# Patient Record
Sex: Male | Born: 1950 | Race: White | Hispanic: No | Marital: Single | Smoking: Former smoker
Health system: Southern US, Community
[De-identification: ages and names within clinical notes are randomized; demographics above are authoritative.]

## PROBLEM LIST (undated history)

## (undated) DIAGNOSIS — E785 Hyperlipidemia, unspecified: Secondary | ICD-10-CM

## (undated) DIAGNOSIS — T7840XA Allergy, unspecified, initial encounter: Secondary | ICD-10-CM

## (undated) DIAGNOSIS — Z860101 Personal history of adenomatous and serrated colon polyps: Secondary | ICD-10-CM

## (undated) DIAGNOSIS — I499 Cardiac arrhythmia, unspecified: Secondary | ICD-10-CM

## (undated) DIAGNOSIS — Z86010 Personal history of colonic polyps: Secondary | ICD-10-CM

## (undated) DIAGNOSIS — K5790 Diverticulosis of intestine, part unspecified, without perforation or abscess without bleeding: Secondary | ICD-10-CM

## (undated) DIAGNOSIS — M109 Gout, unspecified: Secondary | ICD-10-CM

## (undated) DIAGNOSIS — Z8679 Personal history of other diseases of the circulatory system: Secondary | ICD-10-CM

## (undated) HISTORY — DX: Personal history of colonic polyps: Z86.010

## (undated) HISTORY — PX: EXTERNAL EAR SURGERY: SHX627

## (undated) HISTORY — DX: Hyperlipidemia, unspecified: E78.5

## (undated) HISTORY — DX: Personal history of adenomatous and serrated colon polyps: Z86.0101

## (undated) HISTORY — DX: Allergy, unspecified, initial encounter: T78.40XA

## (undated) HISTORY — DX: Gout, unspecified: M10.9

## (undated) HISTORY — PX: POLYPECTOMY: SHX149

## (undated) HISTORY — PX: OTHER SURGICAL HISTORY: SHX169

## (undated) HISTORY — DX: Personal history of other diseases of the circulatory system: Z86.79

## (undated) HISTORY — PX: COLONOSCOPY: SHX174

---
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
## 1985-12-01 HISTORY — PX: HEMORRHOID SURGERY: SHX153

## 2006-11-02 ENCOUNTER — Ambulatory Visit: Payer: Self-pay | Admitting: Family Medicine

## 2009-04-23 ENCOUNTER — Emergency Department (HOSPITAL_COMMUNITY): Admission: EM | Admit: 2009-04-23 | Discharge: 2009-04-23 | Payer: Self-pay | Admitting: Emergency Medicine

## 2009-09-27 ENCOUNTER — Encounter (INDEPENDENT_AMBULATORY_CARE_PROVIDER_SITE_OTHER): Payer: Self-pay | Admitting: *Deleted

## 2009-10-18 ENCOUNTER — Encounter (INDEPENDENT_AMBULATORY_CARE_PROVIDER_SITE_OTHER): Payer: Self-pay

## 2009-10-22 ENCOUNTER — Ambulatory Visit: Payer: Self-pay | Admitting: Gastroenterology

## 2009-10-22 ENCOUNTER — Encounter (INDEPENDENT_AMBULATORY_CARE_PROVIDER_SITE_OTHER): Payer: Self-pay | Admitting: *Deleted

## 2009-12-12 ENCOUNTER — Ambulatory Visit: Payer: Self-pay | Admitting: Gastroenterology

## 2009-12-14 ENCOUNTER — Encounter: Payer: Self-pay | Admitting: Gastroenterology

## 2010-02-17 ENCOUNTER — Encounter: Payer: Self-pay | Admitting: Cardiovascular Disease

## 2010-03-27 DIAGNOSIS — E785 Hyperlipidemia, unspecified: Secondary | ICD-10-CM | POA: Insufficient documentation

## 2010-03-27 DIAGNOSIS — I4949 Other premature depolarization: Secondary | ICD-10-CM | POA: Insufficient documentation

## 2010-03-27 DIAGNOSIS — F17200 Nicotine dependence, unspecified, uncomplicated: Secondary | ICD-10-CM | POA: Insufficient documentation

## 2010-03-27 DIAGNOSIS — R079 Chest pain, unspecified: Secondary | ICD-10-CM | POA: Insufficient documentation

## 2010-03-28 ENCOUNTER — Ambulatory Visit: Payer: Self-pay | Admitting: Cardiovascular Disease

## 2010-03-28 DIAGNOSIS — R002 Palpitations: Secondary | ICD-10-CM | POA: Insufficient documentation

## 2010-04-23 ENCOUNTER — Ambulatory Visit: Payer: Self-pay | Admitting: Cardiovascular Disease

## 2010-04-23 ENCOUNTER — Ambulatory Visit: Payer: Self-pay

## 2010-04-23 ENCOUNTER — Ambulatory Visit (HOSPITAL_COMMUNITY): Admission: RE | Admit: 2010-04-23 | Discharge: 2010-04-23 | Payer: Self-pay | Admitting: Cardiovascular Disease

## 2010-04-23 ENCOUNTER — Ambulatory Visit: Payer: Self-pay | Admitting: Cardiology

## 2010-12-31 NOTE — Assessment & Plan Note (Signed)
Summary: NP3/irregular heart beats   CC:  Referal from Dr. Earlene Plater.  History of Present Illness: Brandon [name] is a [employer]in [employer] who is referred from [facility] by Phineas Semen and Louanna Raw He has had long standing palpitations They were particularly bad in February when he was traveling a lot for work.  He feels skips that can last minutes to hours and has associated dyspnea.  He has not had a previous cardiac w/u.  His ECG at urgent care showed PAC's.  I suspect he has PSVT from time to time.  We discussed options including event monitor , EPS referral and as needed BB.  Since his symptoms are improved he prefers to not have monitor or EPS referral now.  He has had very high lipids in the past but is hesitant to resume statins.  Apparantly one of them made his liver tests abnormal in the past.  I told him that with his smoking he probably should be Rx.  He needs a F/U lipid panel per his primary.  If his LDL is greater than 160 he does not need a lipomed profile.  I counseled him for less than 10 minutes on smoking cessation.  It is a lifesyle for him and he does not seem very motivated to quit as he also chews tobacco.  I explained to him that his ETOH and nicotine intake will make his palpitations worse.    Current Problems (verified): 1)  Palpitations  (ICD-785.1) 2)  Smoker  (ICD-305.1) 3)  Premature Ventricular Contractions  (ICD-427.69) 4)  Chest Pain  (ICD-786.50) 5)  Hyperlipidemia  (ICD-272.4)  Current Medications (verified): 1)  Ibuprofen 200 Mg Tabs (Ibuprofen) .... As Needed 2)  Propranolol Hcl 10 Mg Tabs (Propranolol Hcl) .... Take Once Daily As Needed For Palpitations  Allergies (verified): No Known Drug Allergies  Past History:  Past Medical History: Last updated: 03/27/2010 Current Problems:  Palpitations CHEST PAIN (ICD-786.50) HYPERLIPIDEMIA (ICD-272.4) Smoker  Family History: Last updated: 03/28/2010 non-contributory  Social History: Last updated:  03/28/2010 Drinks regularly has had elevated LFT's on statin Smokes and chews tobacco Divorced with girlfriend From [place] Works at [employer] a lot.     Family History: non-contributory  Social History: Drinks regularly has had elevated LFT's on statin Smokes and chews tobacco Divorced with girlfriend From [place] Works at [employer] a lot.     Review of Systems       Denies fever, malais, weight loss, blurry vision, decreased visual acuity, cough, sputum, SOB, hemoptysis, pleuritic pain, s, heartburn, abdominal pain, melena, lower extremity edema, claudication, or rash.   Vital Signs:  Patient profile:   60 year old male Height:      72 inches Weight:      183 pounds BMI:     24.91 Pulse rate:   63 / minute Resp:     14 per minute BP sitting:   120 / 80  (left arm)  Vitals Entered By: Kem Parkinson (March 28, 2010 10:15 AM)  Physical Exam  General:  Affect appropriate Healthy:  appears stated age HEENT: normal Neck supple with no adenopathy JVP normal no bruits no thyromegaly Lungs clear with no wheezing and good diaphragmatic motion Heart:  S1/S2 no murmur,rub, gallop or click PMI normal Abdomen: benighn, BS positve, no tenderness, no AAA no bruit.  No HSM or HJR Distal pulses intact with no bruits No edema Neuro non-focal Skin warm and dry    Impression & Recommendations:  Problem # 1:  PALPITATIONS (ICD-785.1) as needed beta blocker.  Event monitor and EP referral in future if patient wishes.  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 Take once daily as needed for palpitations  Orders: Echocardiogram (Echo) Treadmill (Treadmill)  Problem # 2:  SMOKER (ICD-305.1) Counseled for less than 10 minutes.  F/U primary.  Little motivation to quit  Problem # 3:  PREMATURE VENTRICULAR CONTRACTIONS (ICD-427.69) Echo to R/O structural heart disease.  ECG  looks for like PAC's to me His updated medication list for this problem includes:    Propranolol Hcl 10 Mg Tabs (Propranolol hcl) .Marland Kitchen... Take once daily as needed for palpitations  Problem # 4:  HYPERLIPIDEMIA (ICD-272.4) Encouraged him to get his lipids rechecked and start statin if LDL greater than 160.  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  Doubt anxiety attack.  Will do ETT to R/O HTN response, arrythmia with exercise and R/O CAD given smoking and lipids His updated medication list for this problem includes:    Propranolol Hcl 10 Mg Tabs (Propranolol hcl) .Marland Kitchen... Take once daily as needed for palpitations  Patient Instructions: 1)  Your physician recommends that you schedule a follow-up appointment as needed 2)  Your physician has recommended you make the following change in your medication: Inderal 10 once daily by mouth as needed for palpitations 3)  Your physician has requested that you have an echocardiogram.  Echocardiography is a painless test that uses sound waves to create images of your heart. It provides your doctor with information about the size and shape of your heart and how well your heart's chambers and valves are working.  This procedure takes approximately one hour. There are no restrictions for this procedure. 4)  Your physician has requested that you have an exercise tolerance test.  For further information please visit [email or web address]  Please also follow instruction sheet, as given. Prescriptions: PROPRANOLOL HCL 10 MG TABS (PROPRANOLOL HCL) take once daily as needed for palpitations  #30 x 0   Entered by:   Dossie Arbour, RN, BSN   Authorized by:   Colon Branch, MD, [facility]   Signed by:   Dossie Arbour, RN, BSN on 03/28/2010   Method used:   Electronically to        [facility] (retail)       [address]        [place], [place]  [address]       Ph: [phone]       Fax: [phone]   RxID:   [id number]    Echocardiogram Report  Procedure date:  02/17/2010  Findings:      NSR PAC Insig Qs in 2,3,F Normal T waves

## 2010-12-31 NOTE — Consult Note (Signed)
Summary: [facility]  [facility]   Imported By: Marylou Mccoy 03/27/2010 12:53:08  _____________________________________________________________________  External Attachment:    Type:   Image     Comment:   External Document  Appended Document: [facility] Prompt Med 3/29 SSCP and palpitations.  Smokes and chews tobacco, cafeine, PVC's on ECG

## 2010-12-31 NOTE — Procedures (Signed)
Summary: Colonoscopy  Patient: [name] Note: All result statuses are Final unless otherwise noted.  Tests: (1) Colonoscopy (COL)   COL Colonoscopy           DONE     [facility]     [address]     [place], [place]  [address]           COLONOSCOPY PROCEDURE REPORT           PATIENT:  Brandon, [name]  MR#:  [id number]     BIRTHDATE:  1951/04/18, 58 yrs. old  GENDER:  male           ENDOSCOPIST:  Rachael Fee, MD     Referred by:  Aleatha Borer, M.D.           PROCEDURE DATE:  12/12/2009     PROCEDURE:  Colonoscopy with snare polypectomy     ASA CLASS:  Class II     INDICATIONS:  Routine Risk Screening           MEDICATIONS:   Fentanyl 50 mcg IV, Versed 5 mg IV           DESCRIPTION OF PROCEDURE:   After the risks benefits and     alternatives of the procedure were thoroughly explained, informed     consent was obtained.  Digital rectal exam was performed and     revealed no rectal masses.   The LB [id number] endoscope     was introduced through the anus and advanced to the cecum, which     was identified by both the appendix and ileocecal valve, without     limitations.  The quality of the prep was excellent, using     MoviPrep.  The instrument was then slowly withdrawn as the colon     was fully examined.     <<PROCEDUREIMAGES>>           FINDINGS:  Mild diverticulosis was found throughout the entire     colon (see image1 and image4).  There were multiple polyps     identified and removed. A total of nine polyps were located and     all were resected (acending, transverse, sigmoid and rectosigmoid     segments). These ranged in size from 2mm to 15mm. Most were     sessile but the largest was pedunculated (located in sigmoid     colon). The polyps were sent in 4 separate jars, most appeared     hyperplastic. Four required snare cautery to be resected (sigmoid     and recto/sigmoid polyps), the rest needed only cold snare (see     image5, image6,  image8, and image10).  Small, non-thrombosed     external hemorrhoids were found (see image9).  This was otherwise     a normal examination of the colon (see image9, image2, and     image3).   Retroflexed views in the rectum revealed no     abnormalities.    The scope was then withdrawn from the patient     and the procedure completed.           COMPLICATIONS:  None           ENDOSCOPIC IMPRESSION:     1) Mild diverticulosis throughout colon     2) Nine polyps, all resected and sent to pathology     3) Small external hemorrhoids     4) Otherwise normal examination  RECOMMENDATIONS:     1) If the polyp(s) removed today are proven to be adenomatous     (pre-cancerous) polyps, you will need a colonoscopy in 3 years.     Otherwise you should continue to follow colorectal cancer     screening guidelines for "routine risk" patients with a     colonoscopy in 10 years.     2) You will receive a letter within 1-2 weeks with the results     of your biopsy as well as final recommendations. Please call my     office if you have not received a letter after 3 weeks.           REPEAT EXAM:  await pathology           ______________________________     Rachael Fee, MD           n.     eSIGNED:   Rachael Fee at 12/12/2009 09:49 AM           Maurice March, [id number]  Note: An exclamation mark (!) indicates a result that was not dispersed into the flowsheet. Document Creation Date: 12/12/2009 9:49 AM _______________________________________________________________________  (1) Order result status: Final Collection or observation date-time: 12/12/2009 09:38 Requested date-time:  Receipt date-time:  Reported date-time:  Referring Physician:   Ordering Physician: Rob Bunting [phone]) Specimen Source:  Source: Launa Grill Order Number: [id number] Lab site:   Appended Document: Colonoscopy recall     Procedures Next Due Date:    Colonoscopy: 12/2012

## 2010-12-31 NOTE — Letter (Signed)
Summary: Results Letter  [facility]  [address] [place], [place] [address]   Phone: [phone]  Fax: [phone]        December 14, 2009 MRN: [id number]    [facility] [address] [place], [place]  [address]    Dear Brandon [name],   The polyp(s) removed during your recent procedure were proven to be adenomatous.  These are pre-cancerous polyps that may have grown into cancers if they had not been removed.  Based on current nationally recognized surveillance guidelines, I recommend that you have a repeat colonoscopy in 3 years.   We will therefore put your information in our reminder system and will contact you in 3 years to schedule a repeat procedure.  Please call if you have any questions or concerns.       Sincerely,  Rachael Fee MD  This letter has been electronically signed by your physician.  Appended Document: Results Letter Letter mailed 01.18.11

## 2012-01-05 DIAGNOSIS — H9192 Unspecified hearing loss, left ear: Secondary | ICD-10-CM | POA: Insufficient documentation

## 2012-01-05 DIAGNOSIS — Z86010 Personal history of colonic polyps: Secondary | ICD-10-CM | POA: Insufficient documentation

## 2012-11-29 ENCOUNTER — Encounter: Payer: Self-pay | Admitting: Gastroenterology

## 2012-12-14 ENCOUNTER — Encounter: Payer: Self-pay | Admitting: Family Medicine

## 2012-12-14 ENCOUNTER — Ambulatory Visit (INDEPENDENT_AMBULATORY_CARE_PROVIDER_SITE_OTHER): Payer: BC Managed Care – PPO | Admitting: Family Medicine

## 2012-12-14 VITALS — BP 125/82 | HR 56 | Ht 72.0 in | Wt 180.0 lb

## 2012-12-14 DIAGNOSIS — M7660 Achilles tendinitis, unspecified leg: Secondary | ICD-10-CM

## 2012-12-14 NOTE — Patient Instructions (Addendum)
You have achilles tendinopathy Aleve 1-2 tabs twice a day with food for pain and inflammation. Lowering/raise on a step exercises 3 x 10 once a day  Can add heel walks, toe walks forward and backward as well Icing 15 minutes at a time after runs and 1-2 other times a day. Avoid uneven ground, hills as much as possible. Heel lifts in running shoes - if bothering you during the day use them in your daily shoes as well. If not improving as expected consider orthotics, physical therapy, nitro patches. For running, I would recommend cutting down to 50% of your typical training mileage for the week and decrease speed a little - each week increase by 10% so at 5 weeks you're back to the training protocol you want. Avoid deep squats and lunges as well. Follow up with me in 6 weeks.

## 2012-12-15 ENCOUNTER — Encounter: Payer: Self-pay | Admitting: Family Medicine

## 2012-12-15 DIAGNOSIS — M7660 Achilles tendinitis, unspecified leg: Secondary | ICD-10-CM | POA: Insufficient documentation

## 2012-12-15 NOTE — Assessment & Plan Note (Signed)
exam normal, very early in course of achilles tendinopathy.  Start home exercise program which was demonstrated today.  Aleve, heel lifts, icing.  Discussed decreasing speed and slowly working back up to training protocol.  Avoid hills, uneven ground, speed work as much as possible right now.  Avoid deep squats and lunges.  F/u in 6 weeks.  Consider PT, nitro patches in future.

## 2012-12-15 NOTE — Progress Notes (Signed)
  Subjective:    Patient ID: Brandon [name], male    DOB: Sep 17, 1951, 62 y.o.   MRN: [id number]  PCP: None listed  HPI 62 yo M here for left achilles pain.  Patient is an avid runner. Planning on doing a marathon in 2 months. Up to 29 miles a week including a 14 mile long run. States 1 week ago left achilles started bothering him but would improve with running. Most recent run started to notice pain toward the end of run, skipped yesterday's run altogether due to this. No pain at rest. Has been icing. Had same issues but worse on right achilles 1 1 /2 years ago.  Past Medical History  Diagnosis Date  . Hyperlipidemia     Current Outpatient Prescriptions on File Prior to Visit  Medication Sig Dispense Refill  . atorvastatin (LIPITOR) 20 MG tablet Take 20 mg by mouth daily.        Past Surgical History  Procedure Date  . Appendectomy   . Hemorrhoid surgery   . External ear surgery     No Known Allergies  History   Social History  . Marital Status: Single    Spouse Name: N/A    Number of Children: N/A  . Years of Education: N/A   Occupational History  . Not on file.   Social History Main Topics  . Smoking status: Former Games developer  . Smokeless tobacco: Current User  . Alcohol Use: Not on file  . Drug Use: Not on file  . Sexually Active: Not on file   Other Topics Concern  . Not on file   Social History Narrative  . No narrative on file    Family History  Problem Relation Age of Onset  . Diabetes Father   . Heart attack Brother   . Sudden death Brother   . Hyperlipidemia Neg Hx   . Hypertension Neg Hx     BP 125/82  Pulse 56  Ht 6' (1.829 m)  Wt 180 lb (81.647 kg)  BMI 24.41 kg/m2  Review of Systems See HPI above.    Objective:   Physical Exam Gen: NAD  L foot/ankle: Mild pes planus. No gross deformity, swelling, ecchymoses FROM ankle with 5/5 strength and no pain. No current TTP (has the pain noninsertional when comes on). Negative ant  drawer and talar tilt.   Negative syndesmotic compression. Thompsons test negative. NV intact distally.     Assessment & Plan:  1. Mild left achilles tendinopathy - exam normal, very early in course of achilles tendinopathy.  Start home exercise program which was demonstrated today.  Aleve, heel lifts, icing.  Discussed decreasing speed and slowly working back up to training protocol.  Avoid hills, uneven ground, speed work as much as possible right now.  Avoid deep squats and lunges.  F/u in 6 weeks.  Consider PT, nitro patches in future.

## 2013-01-06 ENCOUNTER — Encounter: Payer: Self-pay | Admitting: Gastroenterology

## 2013-01-26 ENCOUNTER — Ambulatory Visit (INDEPENDENT_AMBULATORY_CARE_PROVIDER_SITE_OTHER): Payer: BC Managed Care – PPO | Admitting: Family Medicine

## 2013-01-26 ENCOUNTER — Ambulatory Visit: Payer: BC Managed Care – PPO | Admitting: Family Medicine

## 2013-01-26 ENCOUNTER — Encounter: Payer: Self-pay | Admitting: Family Medicine

## 2013-01-26 VITALS — BP 128/80 | HR 59 | Ht 72.0 in | Wt 180.0 lb

## 2013-01-26 DIAGNOSIS — IMO0002 Reserved for concepts with insufficient information to code with codable children: Secondary | ICD-10-CM

## 2013-01-26 DIAGNOSIS — S76319A Strain of muscle, fascia and tendon of the posterior muscle group at thigh level, unspecified thigh, initial encounter: Secondary | ICD-10-CM | POA: Insufficient documentation

## 2013-01-26 DIAGNOSIS — S76312A Strain of muscle, fascia and tendon of the posterior muscle group at thigh level, left thigh, initial encounter: Secondary | ICD-10-CM

## 2013-01-26 NOTE — Progress Notes (Signed)
  Subjective:    Patient ID: Brandon [name], male    DOB: [birth year], 62 y.o.   MRN: [id number]  PCP: None listed  HPI  62 yo M here for left hamstring pain.  1/14: Patient is an avid runner. Planning on doing a marathon in 2 months. Up to 29 miles a week including a 14 mile long run. States 1 week ago left achilles started bothering him but would improve with running. Most recent run started to notice pain toward the end of run, skipped yesterday's run altogether due to this. No pain at rest. Has been icing. Had same issues but worse on right achilles 1 1 /2 years ago.  2/26: Patient states achilles is over 90% improved with home exercises, heel lifts. States over past week getting pain in left hamstring area when running that goes away about 3-4 miles into the run. No bruising or swelling. No prior issues. Some pain behind his knees past time he ran but did not last. No other complaints.  Past Medical History  Diagnosis Date  . Hyperlipidemia     Current Outpatient Prescriptions on File Prior to Visit  Medication Sig Dispense Refill  . atorvastatin (LIPITOR) 20 MG tablet Take 20 mg by mouth daily.       No current facility-administered medications on file prior to visit.    Past Surgical History  Procedure Laterality Date  . Appendectomy    . Hemorrhoid surgery    . External ear surgery      No Known Allergies  History   Social History  . Marital Status: Single    Spouse Name: N/A    Number of Children: N/A  . Years of Education: N/A   Occupational History  . Not on file.   Social History Main Topics  . Smoking status: Former Games developer  . Smokeless tobacco: Current User  . Alcohol Use: Not on file  . Drug Use: Not on file  . Sexually Active: Not on file   Other Topics Concern  . Not on file   Social History Narrative  . No narrative on file    Family History  Problem Relation Age of Onset  . Diabetes Father   . Heart attack Brother   . Sudden  death Brother   . Hyperlipidemia Neg Hx   . Hypertension Neg Hx     BP 128/80  Pulse 59  Ht 6' (1.829 m)  Wt 180 lb (81.647 kg)  BMI 24.41 kg/m2  Review of Systems  See HPI above.    Objective:   Physical Exam  Gen: NAD  Left leg: No gross deformity, swelling, bruising, palpable defect. TTP mildly medial hamstring body.  No other TTP buttock, hip, knee. FROM hip and knee. Pain mild on resisted knee flexion at 30 degrees - no pain at 90 degrees.  No pain with other resisted hip motions.  Strength 5/5. NVI distally.    Assessment & Plan:  1. Left hamstring weakness/spasms - reassured.  Thigh sleeve, home exercise program demonstrated for this.  Tylenol, nsaids as needed.  Heat for spasms as well.  F/u prn.  See instructions.  2. Mild left achilles tendinopathy - significantly improved.  Will continue home exercise program, monitor.

## 2013-01-26 NOTE — Assessment & Plan Note (Signed)
Left hamstring weakness/spasms - reassured.  Thigh sleeve, home exercise program demonstrated for this.  Tylenol, nsaids as needed.  Heat for spasms as well.  F/u prn.  See instructions.

## 2013-01-26 NOTE — Patient Instructions (Addendum)
You are developing hamstring weakness/spasms. Wear compression sleeve when running. Heat 15 minutes at a time 3-4 times a day and before running - use ice after running. Phase 1 Leg curls, hamstring swings, running lunges - use weights with the curls and swings - 3 sets of 10 each exercise - may want to do it on the other leg also. Phase 2 Single leg hops, Jump lunges, increase weight with the above phase 1 exercises. Consider formal physical therapy if not improving. Do exercises daily for next 6 weeks. Follow up with me as needed.

## 2013-01-26 NOTE — Assessment & Plan Note (Signed)
significantly improved.  Will continue home exercise program, monitor.

## 2013-02-02 ENCOUNTER — Ambulatory Visit (AMBULATORY_SURGERY_CENTER): Payer: BC Managed Care – PPO | Admitting: *Deleted

## 2013-02-02 ENCOUNTER — Telehealth: Payer: Self-pay

## 2013-02-02 VITALS — Ht 72.0 in | Wt 183.0 lb

## 2013-02-02 DIAGNOSIS — Z1211 Encounter for screening for malignant neoplasm of colon: Secondary | ICD-10-CM

## 2013-02-02 DIAGNOSIS — Z86010 Personal history of colonic polyps: Secondary | ICD-10-CM

## 2013-02-02 MED ORDER — NA SULFATE-K SULFATE-MG SULF 17.5-3.13-1.6 GM/177ML PO SOLN: ORAL | Status: DC

## 2013-02-02 NOTE — Telephone Encounter (Signed)
Yes, thanks

## 2013-02-02 NOTE — Telephone Encounter (Signed)
Message copied by Donata Duff on Wed Feb 02, 2013  4:44 PM ------      Message from: Lucia Gaskins      Created: Wed Feb 02, 2013  3:52 PM       Patient is running a marathon and wants colonoscopy moved to later that week. He is off that week also, so he could not change to the following week.  He does not need propofol. New appointment for 02-18-13 at 9:30 am. Thanks  ------

## 2013-02-02 NOTE — Telephone Encounter (Signed)
Dr Christella Hartigan you are scheduled for propofol cases the week the pt needs appt so I had them put him in as a non propofol on your propofol day because of his work schedule.  Is this ok?

## 2013-02-03 ENCOUNTER — Encounter: Payer: Self-pay | Admitting: Gastroenterology

## 2013-02-14 ENCOUNTER — Other Ambulatory Visit: Payer: BC Managed Care – PPO | Admitting: Gastroenterology

## 2013-02-16 ENCOUNTER — Other Ambulatory Visit: Payer: BC Managed Care – PPO | Admitting: Gastroenterology

## 2013-02-18 ENCOUNTER — Ambulatory Visit (AMBULATORY_SURGERY_CENTER): Payer: BC Managed Care – PPO | Admitting: Gastroenterology

## 2013-02-18 ENCOUNTER — Encounter: Payer: Self-pay | Admitting: Gastroenterology

## 2013-02-18 VITALS — BP 118/65 | HR 52 | Temp 96.4°F | Resp 16 | Ht 71.0 in | Wt 183.0 lb

## 2013-02-18 DIAGNOSIS — Z1211 Encounter for screening for malignant neoplasm of colon: Secondary | ICD-10-CM

## 2013-02-18 DIAGNOSIS — D126 Benign neoplasm of colon, unspecified: Secondary | ICD-10-CM

## 2013-02-18 DIAGNOSIS — Z860100 Personal history of colon polyps, unspecified: Secondary | ICD-10-CM

## 2013-02-18 DIAGNOSIS — K5730 Diverticulosis of large intestine without perforation or abscess without bleeding: Secondary | ICD-10-CM

## 2013-02-18 MED ORDER — SODIUM CHLORIDE 0.9 % IV SOLN: 500.0000 mL | INTRAVENOUS | Status: DC

## 2013-02-18 NOTE — Patient Instructions (Addendum)
YOU HAD AN ENDOSCOPIC PROCEDURE TODAY AT THE [facility]: Refer to the procedure report that was given to you for any specific questions about what was found during the examination.  If the procedure report does not answer your questions, please call your gastroenterologist to clarify.  If you requested that your care partner not be given the details of your procedure findings, then the procedure report has been included in a sealed envelope for you to review at your convenience later.  YOU SHOULD EXPECT: Some feelings of bloating in the abdomen. Passage of more gas than usual.  Walking can help get rid of the air that was put into your GI tract during the procedure and reduce the bloating. If you had a lower endoscopy (such as a colonoscopy or flexible sigmoidoscopy) you may notice spotting of blood in your stool or on the toilet paper. If you underwent a bowel prep for your procedure, then you may not have a normal bowel movement for a few days.  DIET: Your first meal following the procedure should be a light meal and then it is ok to progress to your normal diet.  A half-sandwich or bowl of soup is an example of a good first meal.  Heavy or fried foods are harder to digest and may make you feel nauseous or bloated.  Likewise meals heavy in dairy and vegetables can cause extra gas to form and this can also increase the bloating.  Drink plenty of fluids but you should avoid alcoholic beverages for 24 hours.  ACTIVITY: Your care partner should take you home directly after the procedure.  You should plan to take it easy, moving slowly for the rest of the day.  You can resume normal activity the day after the procedure however you should NOT DRIVE or use heavy machinery for 24 hours (because of the sedation medicines used during the test).    SYMPTOMS TO REPORT IMMEDIATELY: A gastroenterologist can be reached at any hour.  During normal business hours, 8:30 AM to 5:00 PM Monday through Friday,  call [phone].  After hours and on weekends, please call the GI answering service at [phone] emergency number who will take a message and have the physician on call contact you.   Following lower endoscopy (colonoscopy or flexible sigmoidoscopy):  Excessive amounts of blood in the stool  Significant tenderness or worsening of abdominal pains  Swelling of the abdomen that is new, acute  Fever of 100F or higher  FOLLOW UP: If any biopsies were taken you will be contacted by phone or by letter within the next 1-3 weeks.  Call your gastroenterologist if you have not heard about the biopsies in 3 weeks.  Our staff will call the home number listed on your records the next business day following your procedure to check on you and address any questions or concerns that you may have at that time regarding the information given to you following your procedure. This is a courtesy call and so if there is no answer at the home number and we have not heard from you through the emergency physician on call, we will assume that you have returned to your regular daily activities without incident.  SIGNATURES/CONFIDENTIALITY: You and/or your care partner have signed paperwork which will be entered into your electronic medical record.  These signatures attest to the fact that that the information above on your After Visit Summary has been reviewed and is understood.  Full responsibility of the confidentiality   of this discharge information lies with you and/or your care-partner.  Handout on polyps  

## 2013-02-18 NOTE — Op Note (Signed)
[facility] [address] [place], [address]   COLONOSCOPY PROCEDURE REPORT  PATIENT: Brandon, [name]  MR#: [id number] BIRTHDATE: Nov 17, 1951 , 61  yrs. old GENDER: Male ENDOSCOPIST: Rachael Fee, MD PROCEDURE DATE:  02/18/2013 PROCEDURE:   Colonoscopy with snare polypectomy ASA CLASS:   Class II INDICATIONS:nine serrated adenomas in 2011. MEDICATIONS: Fentanyl 50 mcg IV, Versed 4 mg IV, and These medications were titrated to patient response per physician's verbal order  DESCRIPTION OF PROCEDURE:   After the risks benefits and alternatives of the procedure were thoroughly explained, informed consent was obtained.  A digital rectal exam revealed no abnormalities of the rectum.   The Pentax Ped Colon [id number] endoscope was introduced through the anus and advanced to the cecum, which was identified by both the appendix and ileocecal valve. No adverse events experienced.   The quality of the prep was good.  The instrument was then slowly withdrawn as the colon was fully examined.  COLON FINDINGS: Seven small polyps were found, all were removed and all were sent to pathology.  These were all sessile, 3-62mm across, located in ascending, transverse, descending and sigmoid segments; all removed with cold snare.  There were a few small left sided diverticlum.  The examination was otherwise normal.  Retroflexed views revealed no abnormalities. The time to cecum=2 minutes 27 seconds.  Withdrawal time=14 minutes 52 seconds.  The scope was withdrawn and the procedure completed. COMPLICATIONS: There were no complications.  ENDOSCOPIC IMPRESSION: Seven small polyps were found, all were removed and all were sent to pathology. The examination was otherwise normal.  RECOMMENDATIONS: If the polyp(s) removed today are proven to be adenomatous (pre-cancerous) polyps, you will need a colonoscopy in 3 years. You will receive a letter within 1-2 weeks with the results of  your biopsy as well as final recommendations.  Please call my office if you have not received a letter after 3 weeks.   eSigned:  Rachael Fee, MD 02/18/2013 9:39 AM

## 2013-02-18 NOTE — Progress Notes (Signed)
Called to room to assist during endoscopic procedure.  Patient ID and intended procedure confirmed with present staff. Received instructions for my participation in the procedure from the performing physician.  

## 2013-02-18 NOTE — Progress Notes (Signed)
Patient did not experience any of the following events: a burn prior to discharge; a fall within the facility; wrong site/side/patient/procedure/implant event; or a hospital transfer or hospital admission upon discharge from the facility. (G8907)Patient did not have preoperative order for IV antibiotic SSI prophylaxis. ([id number]) ewm 

## 2013-02-21 ENCOUNTER — Telehealth: Payer: Self-pay | Admitting: *Deleted

## 2013-02-21 NOTE — Telephone Encounter (Signed)
  Follow up Call-  Call back number 02/18/2013  Post procedure Call Back phone  # [phone]  Permission to leave phone message Yes     Patient questions:  Do you have a fever, pain , or abdominal swelling? no Pain Score  0 *  Have you tolerated food without any problems? yes  Have you been able to return to your normal activities? yes  Do you have any questions about your discharge instructions: Diet   no Medications  no Follow up visit  no  Do you have questions or concerns about your Care? no  Actions: * If pain score is 4 or above: No action needed, pain <4.

## 2013-02-25 ENCOUNTER — Encounter: Payer: Self-pay | Admitting: Gastroenterology

## 2014-08-23 ENCOUNTER — Other Ambulatory Visit: Payer: Self-pay | Admitting: Dermatology

## 2016-02-21 ENCOUNTER — Encounter: Payer: Self-pay | Admitting: Gastroenterology

## 2016-03-17 ENCOUNTER — Encounter: Payer: Self-pay | Admitting: Gastroenterology

## 2016-04-23 ENCOUNTER — Ambulatory Visit (AMBULATORY_SURGERY_CENTER): Payer: Self-pay | Admitting: *Deleted

## 2016-04-23 VITALS — Ht 72.0 in | Wt 187.6 lb

## 2016-04-23 DIAGNOSIS — Z86010 Personal history of colonic polyps: Secondary | ICD-10-CM

## 2016-04-23 MED ORDER — NA SULFATE-K SULFATE-MG SULF 17.5-3.13-1.6 GM/177ML PO SOLN: ORAL | Status: DC

## 2016-04-23 NOTE — Progress Notes (Signed)
No allergies to eggs or soy. No problems with anesthesia.  Pt given Emmi instructions for colonoscopy  No oxygen use  No diet drug use  

## 2016-05-02 ENCOUNTER — Encounter: Payer: Self-pay | Admitting: Gastroenterology

## 2016-05-07 ENCOUNTER — Encounter: Payer: Self-pay | Admitting: Gastroenterology

## 2016-05-09 ENCOUNTER — Encounter: Payer: Self-pay | Admitting: Gastroenterology

## 2016-05-09 ENCOUNTER — Ambulatory Visit: Payer: BLUE CROSS/BLUE SHIELD | Admitting: Gastroenterology

## 2016-05-09 VITALS — BP 114/70 | HR 61 | Temp 97.7°F | Resp 14 | Ht 72.0 in | Wt 187.0 lb

## 2016-05-09 DIAGNOSIS — D123 Benign neoplasm of transverse colon: Secondary | ICD-10-CM | POA: Diagnosis not present

## 2016-05-09 DIAGNOSIS — D122 Benign neoplasm of ascending colon: Secondary | ICD-10-CM

## 2016-05-09 DIAGNOSIS — Z86010 Personal history of colonic polyps: Secondary | ICD-10-CM

## 2016-05-09 MED ORDER — SODIUM CHLORIDE 0.9 % IV SOLN: 500.0000 mL | INTRAVENOUS | Status: DC

## 2016-05-09 NOTE — Progress Notes (Signed)
Report to PACU, RN, vss, BBS= Clear.  

## 2016-05-09 NOTE — Progress Notes (Signed)
Called to room to assist during endoscopic procedure.  Patient ID and intended procedure confirmed with present staff. Received instructions for my participation in the procedure from the performing physician.  

## 2016-05-09 NOTE — Patient Instructions (Signed)

## 2016-05-09 NOTE — Op Note (Signed)
[place] Patient Name: Brandon [name] Procedure Date: 05/09/2016 1:58 PM MRN: [id number] Endoscopist: Milus Banister , MD Age: 65 Referring MD:  Date of Birth: [birth year] Gender: Male Procedure:                Colonoscopy Indications:              High risk colon cancer surveillance: Personal                            history of colonic polyps (7 mixed SSA, TAs 2014                            and 9 SSAs 2011) Medicines:                Monitored Anesthesia Care Procedure:                Pre-Anesthesia Assessment:                           - Prior to the procedure, a History and Physical                            was performed, and patient medications and                            allergies were reviewed. The patient's tolerance of                            previous anesthesia was also reviewed. The risks                            and benefits of the procedure and the sedation                            options and risks were discussed with the patient.                            All questions were answered, and informed consent                            was obtained. Prior Anticoagulants: The patient has                            taken no previous anticoagulant or antiplatelet                            agents. ASA Grade Assessment: II - A patient with                            mild systemic disease. After reviewing the risks                            and benefits, the patient was deemed in  satisfactory condition to undergo the procedure.                           After obtaining informed consent, the colonoscope                            was passed under direct vision. Throughout the                            procedure, the patient's blood pressure, pulse, and                            oxygen saturations were monitored continuously. The                            Model CF-[id number]) scope was introduced      through the anus and advanced to the the cecum,                            identified by appendiceal orifice and ileocecal                            valve. The colonoscopy was performed without                            difficulty. The patient tolerated the procedure                            well. The quality of the bowel preparation was                            excellent. The ileocecal valve, appendiceal                            orifice, and rectum were photographed. Scope In: 2:06:47 PM Scope Out: 2:21:06 PM Scope Withdrawal Time: 0 hours 12 minutes 27 seconds  Total Procedure Duration: 0 hours 14 minutes 19 seconds  Findings:                 Four sessile polyps were found in the transverse                            colon and ascending colon. The polyps were 9 to 12                            mm in size. These polyps were removed with a cold                            snare. Resection and retrieval were complete.                           The exam was otherwise without abnormality on  direct and retroflexion views. Complications:            No immediate complications. Estimated blood loss:                            None. Estimated Blood Loss:     Estimated blood loss: none. Impression:               - Four 9 to 12 mm polyps in the transverse colon                            and in the ascending colon, removed with a cold                            snare. Resected and retrieved.                           - The examination was otherwise normal on direct                            and retroflexion views. Recommendation:           - Patient has a contact number available for                            emergencies. The signs and symptoms of potential                            delayed complications were discussed with the                            patient. Return to normal activities tomorrow.                            Written discharge instructions  were provided to the                            patient.                           - Resume previous diet.                           - Continue present medications.                           You will receive a letter within 2-3 weeks with the                            pathology results and my final recommendations.                           If the polyp(s) is proven to be 'pre-cancerous' on                            pathology, you will need repeat colonoscopy in 3  years. Milus Banister, MD 05/09/2016 2:24:38 PM This report has been signed electronically.

## 2016-05-11 ENCOUNTER — Inpatient Hospital Stay (HOSPITAL_COMMUNITY)
Admission: EM | Admit: 2016-05-11 | Discharge: 2016-05-12 | DRG: 310 | Disposition: A | Discharge status: Home / Self Care | Payer: BLUE CROSS/BLUE SHIELD | Attending: Internal Medicine | Admitting: Internal Medicine

## 2016-05-11 ENCOUNTER — Emergency Department (HOSPITAL_COMMUNITY): Payer: BLUE CROSS/BLUE SHIELD

## 2016-05-11 ENCOUNTER — Encounter (HOSPITAL_COMMUNITY): Payer: Self-pay | Admitting: Emergency Medicine

## 2016-05-11 DIAGNOSIS — R9431 Abnormal electrocardiogram [ECG] [EKG]: Secondary | ICD-10-CM | POA: Diagnosis present

## 2016-05-11 DIAGNOSIS — Z91018 Allergy to other foods: Secondary | ICD-10-CM | POA: Diagnosis not present

## 2016-05-11 DIAGNOSIS — Z789 Other specified health status: Secondary | ICD-10-CM | POA: Diagnosis not present

## 2016-05-11 DIAGNOSIS — F17210 Nicotine dependence, cigarettes, uncomplicated: Secondary | ICD-10-CM | POA: Diagnosis present

## 2016-05-11 DIAGNOSIS — E785 Hyperlipidemia, unspecified: Secondary | ICD-10-CM | POA: Diagnosis present

## 2016-05-11 DIAGNOSIS — I4891 Unspecified atrial fibrillation: Secondary | ICD-10-CM | POA: Diagnosis present

## 2016-05-11 DIAGNOSIS — F17290 Nicotine dependence, other tobacco product, uncomplicated: Secondary | ICD-10-CM | POA: Diagnosis present

## 2016-05-11 DIAGNOSIS — F17200 Nicotine dependence, unspecified, uncomplicated: Secondary | ICD-10-CM | POA: Diagnosis not present

## 2016-05-11 DIAGNOSIS — Z79899 Other long term (current) drug therapy: Secondary | ICD-10-CM

## 2016-05-11 DIAGNOSIS — Z8249 Family history of ischemic heart disease and other diseases of the circulatory system: Secondary | ICD-10-CM

## 2016-05-11 DIAGNOSIS — R000 Tachycardia, unspecified: Secondary | ICD-10-CM | POA: Diagnosis present

## 2016-05-11 HISTORY — DX: Cardiac arrhythmia, unspecified: I49.9

## 2016-05-11 HISTORY — DX: Diverticulosis of intestine, part unspecified, without perforation or abscess without bleeding: K57.90

## 2016-05-11 LAB — CBC WITH DIFFERENTIAL/PLATELET
BASOS PCT: 0 %
Basophils Absolute: 0 10*3/uL (ref 0.0–0.1)
EOS ABS: 0.1 10*3/uL (ref 0.0–0.7)
Eosinophils Relative: 1 %
HCT: 45.9 % (ref 39.0–52.0)
HEMOGLOBIN: 15.8 g/dL (ref 13.0–17.0)
LYMPHS ABS: 3 10*3/uL (ref 0.7–4.0)
Lymphocytes Relative: 37 %
MCH: 30.7 pg (ref 26.0–34.0)
MCHC: 34.4 g/dL (ref 30.0–36.0)
MCV: 89.1 fL (ref 78.0–100.0)
MONOS PCT: 11 %
Monocytes Absolute: 0.9 10*3/uL (ref 0.1–1.0)
NEUTROS ABS: 4.2 10*3/uL (ref 1.7–7.7)
NEUTROS PCT: 51 %
Platelets: 183 10*3/uL (ref 150–400)
RBC: 5.15 MIL/uL (ref 4.22–5.81)
RDW: 13.1 % (ref 11.5–15.5)
WBC: 8.1 10*3/uL (ref 4.0–10.5)

## 2016-05-11 LAB — TROPONIN I
Troponin I: 0.03 ng/mL (ref <0.031)
Troponin I: 0.04 ng/mL — ABNORMAL HIGH (ref <0.031)

## 2016-05-11 LAB — TSH: TSH: 0.763 u[IU]/mL (ref 0.350–4.500)

## 2016-05-11 LAB — BRAIN NATRIURETIC PEPTIDE: B NATRIURETIC PEPTIDE 5: 59.5 pg/mL (ref 0.0–100.0)

## 2016-05-11 LAB — RAPID URINE DRUG SCREEN, HOSP PERFORMED
Amphetamines: NOT DETECTED
Barbiturates: NOT DETECTED
Benzodiazepines: NOT DETECTED
Cocaine: NOT DETECTED
Opiates: NOT DETECTED
Tetrahydrocannabinol: NOT DETECTED

## 2016-05-11 LAB — BASIC METABOLIC PANEL
ANION GAP: 7 (ref 5–15)
BUN: 20 mg/dL (ref 6–20)
CALCIUM: 9.6 mg/dL (ref 8.9–10.3)
CO2: 26 mmol/L (ref 22–32)
Chloride: 108 mmol/L (ref 101–111)
Creatinine, Ser: 1.04 mg/dL (ref 0.61–1.24)
GFR calc Af Amer: >60 mL/min (ref >60)
GLUCOSE: 112 mg/dL — AB (ref 65–99)
Potassium: 4.5 mmol/L (ref 3.5–5.1)
Sodium: 141 mmol/L (ref 135–145)

## 2016-05-11 LAB — MRSA PCR SCREENING: MRSA by PCR: NEGATIVE

## 2016-05-11 LAB — MAGNESIUM: MAGNESIUM: 1.9 mg/dL (ref 1.7–2.4)

## 2016-05-11 LAB — PHOSPHORUS: Phosphorus: 3.4 mg/dL (ref 2.5–4.6)

## 2016-05-11 LAB — D-DIMER, QUANTITATIVE (NOT AT ARMC)

## 2016-05-11 MED ORDER — ENOXAPARIN SODIUM 40 MG/0.4ML ~~LOC~~ SOLN
40.0000 mg | SUBCUTANEOUS | Status: DC
  Administered 2016-05-11: 40 mg via SUBCUTANEOUS
  Filled 2016-05-11: qty 0.4

## 2016-05-11 MED ORDER — DILTIAZEM LOAD VIA INFUSION
10.0000 mg | Freq: Once | INTRAVENOUS | Status: AC
  Administered 2016-05-11: 10 mg via INTRAVENOUS
  Filled 2016-05-11: qty 10

## 2016-05-11 MED ORDER — SODIUM CHLORIDE 0.9 % IV SOLN
INTRAVENOUS | Status: AC
  Administered 2016-05-11: 14:00:00 via INTRAVENOUS

## 2016-05-11 MED ORDER — DILTIAZEM HCL 100 MG IV SOLR
5.0000 mg/h | INTRAVENOUS | Status: DC
  Administered 2016-05-11 (×2): 5 mg/h via INTRAVENOUS
  Filled 2016-05-11 (×2): qty 100

## 2016-05-11 MED ORDER — OMEGA-3-ACID ETHYL ESTERS 1 G PO CAPS
3.0000 g | ORAL_CAPSULE | Freq: Every day | ORAL | Status: DC
  Administered 2016-05-11 – 2016-05-12 (×2): 3 g via ORAL
  Filled 2016-05-11 (×2): qty 3

## 2016-05-11 MED ORDER — CHLORDIAZEPOXIDE HCL 25 MG PO CAPS: 25.0000 mg | ORAL_CAPSULE | ORAL | Status: DC | PRN

## 2016-05-11 MED ORDER — DILTIAZEM HCL 100 MG IV SOLR: 10.0000 mg/h | Freq: Once | INTRAVENOUS | Status: DC

## 2016-05-11 MED ORDER — ASPIRIN EC 81 MG PO TBEC
81.0000 mg | DELAYED_RELEASE_TABLET | Freq: Every day | ORAL | Status: DC
  Administered 2016-05-11 – 2016-05-12 (×2): 81 mg via ORAL
  Filled 2016-05-11 (×2): qty 1

## 2016-05-11 MED ORDER — DILTIAZEM HCL 25 MG/5ML IV SOLN
15.0000 mg | Freq: Once | INTRAVENOUS | Status: AC
  Administered 2016-05-11: 15 mg via INTRAVENOUS
  Filled 2016-05-11: qty 5

## 2016-05-11 MED ORDER — ATORVASTATIN CALCIUM 40 MG PO TABS
40.0000 mg | ORAL_TABLET | Freq: Every day | ORAL | Status: DC
  Administered 2016-05-11 – 2016-05-12 (×2): 40 mg via ORAL
  Filled 2016-05-11 (×2): qty 1

## 2016-05-11 MED ORDER — OMEGA 3 1000 MG PO CAPS: 3000.0000 mg | ORAL_CAPSULE | Freq: Every day | ORAL | Status: DC

## 2016-05-11 MED ORDER — SODIUM CHLORIDE 0.9 % IV SOLN
INTRAVENOUS | Status: DC
  Administered 2016-05-11: 11:00:00 via INTRAVENOUS

## 2016-05-11 NOTE — ED Provider Notes (Signed)
CSN: [id number]     Arrival date & time 05/11/16  1038 History   First MD Initiated Contact with Patient 05/11/16 1047     Chief Complaint  Patient presents with  . Tachycardia  . Chest Pain      HPI Pt was seen at 1045. Per pt, c/o gradual onset and persistence of constant lightheadedness that began this morning. Has been associated with palpitations, chest "pressure," and nausea. Pt states he has hx of "irregular heart beats" but no official dx. Pt states he "thought maybe I was hung over" this morning after "having a party last night." Denies vomiting/diarrhea, no abd pain, no cough, no fevers, no back pain, no focal motor weakness, no tingling/numbness in extremities.    Past Medical History  Diagnosis Date  . Hyperlipidemia   . Irregular heart beats     "for a while"  . Diverticulosis    Past Surgical History  Procedure Laterality Date  . Hemorrhoid surgery  1987  . External ear surgery  1997, 2000  . Appendectomy  1968   Family History  Problem Relation Age of Onset  . Diabetes Father   . Heart attack Brother   . Sudden death Brother   . Hyperlipidemia Neg Hx   . Hypertension Neg Hx   . Colon cancer Neg Hx    Social History  Substance Use Topics  . Smoking status: Current Some Day Smoker -- 0.25 packs/day  . Smokeless tobacco: Current User    Types: Snuff     Comment: smokes 3 cigarettes a day  . Alcohol Use: 8.4 oz/week    14 Glasses of wine per week    Review of Systems ROS: Statement: All systems negative except as marked or noted in the HPI; Constitutional: Negative for fever and chills. ; ; Eyes: Negative for eye pain, redness and discharge. ; ; ENMT: Negative for ear pain, hoarseness, nasal congestion, sinus pressure and sore throat. ; ; Cardiovascular: +CP, palpitations. Negative for diaphoresis, dyspnea and peripheral edema. ; ; Respiratory: Negative for cough, wheezing and stridor. ; ; Gastrointestinal: +nausea. Negative for vomiting, diarrhea, abdominal  pain, blood in stool, hematemesis, jaundice and rectal bleeding. . ; ; Genitourinary: Negative for dysuria, flank pain and hematuria. ; ; Musculoskeletal: Negative for back pain and neck pain. Negative for swelling and trauma.; ; Skin: Negative for pruritus, rash, abrasions, blisters, bruising and skin lesion.; ; Neuro: +lightheadedness. Negative for headache and neck stiffness. Negative for weakness, altered level of consciousness, altered mental status, extremity weakness, paresthesias, involuntary movement, seizure and syncope.      Allergies  Review of patient's allergies indicates no known allergies.  Home Medications   Prior to Admission medications   Medication Sig Start Date End Date Taking? Authorizing Provider  atorvastatin (LIPITOR) 20 MG tablet Take 40 mg by mouth daily.     Historical Provider, MD  OMEGA 3 1000 MG CAPS Take 3,000 mg by mouth daily.    Historical Provider, MD   BP 122/93 mmHg  Pulse 113  Temp(Src) 98.2 F (36.8 C) (Oral)  Resp 12  SpO2 97% Physical Exam  1050: Physical examination:  Nursing notes reviewed; Vital signs and O2 SAT reviewed;  Constitutional: Well developed, Well nourished, Well hydrated, In no acute distress; Head:  Normocephalic, atraumatic; Eyes: EOMI, PERRL, No scleral icterus; ENMT: Mouth and pharynx normal, Mucous membranes moist; Neck: Supple, Full range of motion, No lymphadenopathy; Cardiovascular: Tachycardic rate and irregular rhythm, No gallop; Respiratory: Breath sounds clear & equal bilaterally,  No wheezes.  Speaking full sentences with ease, Normal respiratory effort/excursion; Chest: Nontender, Movement normal; Abdomen: Soft, Nontender, Nondistended, Normal bowel sounds; Genitourinary: No CVA tenderness; Extremities: Pulses normal, No tenderness, No edema, No calf edema or asymmetry.; Neuro: AA&Ox3, Major CN grossly intact.  Speech clear. No gross focal motor or sensory deficits in extremities.; Skin: Color normal, Warm, Dry.   ED  Course  Procedures (including critical care time) Labs Review   Imaging Review  I have personally reviewed and evaluated these images and lab results as part of my medical decision-making.   EKG Interpretation   Date/Time:  Sunday May 11 2016 10:45:18 EDT Ventricular Rate:  156 PR Interval:    QRS Duration: 82 QT Interval:  281 QTC Calculation: 453 R Axis:   76 Text Interpretation:  Atrial fibrillation Minimal ST depression, inferior  leads No old tracing to compare Confirmed by [facility]  MD, Nunzio Cory [phone])  on 05/11/2016 11:26:09 AM      MDM  MDM Reviewed: previous chart, nursing note and vitals Reviewed previous: labs and ECG Interpretation: labs, ECG and x-ray Total time providing critical care: 30-74 minutes. This excludes time spent performing separately reportable procedures and services. Consults: admitting MD   CRITICAL CARE Performed by: Alfonzo Feller Total critical care time: 35 minutes Critical care time was exclusive of separately billable procedures and treating other patients. Critical care was necessary to treat or prevent imminent or life-threatening deterioration. Critical care was time spent personally by me on the following activities: development of treatment plan with patient and/or surrogate as well as nursing, discussions with consultants, evaluation of patient's response to treatment, examination of patient, obtaining history from patient or surrogate, ordering and performing treatments and interventions, ordering and review of laboratory studies, ordering and review of radiographic studies, pulse oximetry and re-evaluation of patient's condition.   Results for orders placed or performed during the hospital encounter of [id number]  Basic metabolic panel  Result Value Ref Range   Sodium 141 135 - 145 mmol/L   Potassium 4.5 3.5 - 5.1 mmol/L   Chloride 108 101 - 111 mmol/L   CO2 26 22 - 32 mmol/L   Glucose, Bld 112 (H) 65 - 99 mg/dL   BUN 20  6 - 20 mg/dL   Creatinine, Ser 1.04 0.61 - 1.24 mg/dL   Calcium 9.6 8.9 - 10.3 mg/dL   GFR calc non Af Amer >60 >60 mL/min   GFR calc Af Amer >60 >60 mL/min   Anion gap 7 5 - 15  Troponin I  Result Value Ref Range   Troponin I 0.03 <0.031 ng/mL  CBC with Differential  Result Value Ref Range   WBC 8.1 4.0 - 10.5 K/uL   RBC 5.15 4.22 - 5.81 MIL/uL   Hemoglobin 15.8 13.0 - 17.0 g/dL   HCT 45.9 39.0 - 52.0 %   MCV 89.1 78.0 - 100.0 fL   MCH 30.7 26.0 - 34.0 pg   MCHC 34.4 30.0 - 36.0 g/dL   RDW 13.1 11.5 - 15.5 %   Platelets 183 150 - 400 K/uL   Neutrophils Relative % 51 %   Neutro Abs 4.2 1.7 - 7.7 K/uL   Lymphocytes Relative 37 %   Lymphs Abs 3.0 0.7 - 4.0 K/uL   Monocytes Relative 11 %   Monocytes Absolute 0.9 0.1 - 1.0 K/uL   Eosinophils Relative 1 %   Eosinophils Absolute 0.1 0.0 - 0.7 K/uL   Basophils Relative 0 %   Basophils Absolute 0.0 0.0 -  0.1 K/uL   Dg Chest Port 1 View 05/11/2016  CLINICAL DATA:  Tachyarrhythmia. EXAM: PORTABLE CHEST 1 VIEW COMPARISON:  Abdomen CT dated 02/16/2014. FINDINGS: Grossly normal sized heart. Mildly prominent pulmonary vasculature and interstitial markings. The interstitial markings were mildly prominent at the lung bases on the previous abdomen CT. No pleural fluid. Thoracic spine degenerative changes. IMPRESSION: Mild pulmonary vascular congestion and mild interstitial lung disease, most likely chronic. Electronically Signed   By: Claudie Revering M.D.   On: 05/11/2016 11:09    1220:  Monitor initially afib/RVR, rates 160's. IV cardizem bolus and gtt ordered. HR started to decrease into 110's, but slowly began to drift upwards again. 2nd IV cardizem bolus given with gtt rate increased. HR slowly decreasing into 90-110's/afib. Dx and testing d/w pt and family.  Questions answered.  Verb understanding, agreeable to admit. T/C to [facility]. Marthenia Rolling, case discussed, including:  HPI, pertinent PM/SHx, VS/PE, dx testing, ED course and treatment:  Agreeable  to admit, requests to add TSH, d-dimer to labs, write temporary orders, obtain stepdown bed to team [facility].   Francine Graven, DO 05/12/16 [phone]

## 2016-05-11 NOTE — Progress Notes (Signed)
Patient's heart rate dropped to 58 while on cardizem drip at 5mg /hr. RN paused drip and paged on call NP; NP instructed RN to resume drip at 5mg /hr and call him if the heart rate dropped again. Will continue to monitor.

## 2016-05-11 NOTE — ED Notes (Addendum)
Pt with history of irregular heart rates and tachycardia c/o chest pressure, lightheadedness and dizziness onset this morning. Pt states he drank alcohol last night and initially attributed symptoms to this. Heart rate irregular, about 160 bpm.

## 2016-05-11 NOTE — H&P (Signed)
History and Physical  Brandon [name] [id number] DOB: 10-08-1951 DOA: 05/11/2016  Referring physician: ER Physician PCP: No primary care provider on file.  Outpatient Specialists:   Patient coming from: Home  Chief Complaint: Afib RVR  HPI: 65 year old Male with documented history of hyperlipidemia, "irregular heart beats" and diverticulosis. Patient reported experiencing dizziness and chest pressure while shopping at [employer] today. According to the patient, he drank a significant of alcohol yesterday and felt he was experiencing a hang over earlier. He reports daily alcohol use but was not very forthcoming with the fact that he may be an alcoholic. Due to severity of the symptoms, the patient to the ER and was found to be in atrial fibrillation with RVR (heart rate in the 150's). Patient is currently  On Cardizem drip with significant improvement in the heart rate. No chest pain or pressures currently, no SOB, no fever or chills, no headache, no neck pain, no GI symptoms and no urinary symptoms. Patient denied history of hypertension, denied history of prior CVA or thromboembolic or any cardiac disease.  ED Course: On Cardizem drip  Pertinent labs: Abnormal EKG EKG: Independently reviewed.  Imaging: independently reviewed.   Review of Systems: As in HPI. Negative for fever, visual changes, sore throat, rash, new muscle aches, chest pain, SOB, dysuria, bleeding, n/v/abdominal pain.  Past Medical History  Diagnosis Date  . Hyperlipidemia   . Irregular heart beats     "for a while"  . Diverticulosis     Past Surgical History  Procedure Laterality Date  . Hemorrhoid surgery  1987  . External ear surgery  1997, 2000  . Appendectomy  1968     reports that he has been smoking.  His smokeless tobacco use includes Snuff. He reports that he drinks about 8.4 oz of alcohol per week. He reports that he does not use illicit drugs.  Allergies  Allergen Reactions  . Orange Concentrate  [Flavoring Agent] Shortness Of Breath and Swelling    Orange flavor dye    Family History  Problem Relation Age of Onset  . Diabetes Father   . Heart attack Brother   . Sudden death Brother   . Hyperlipidemia Neg Hx   . Hypertension Neg Hx   . Colon cancer Neg Hx      Prior to Admission medications   Medication Sig Start Date End Date Taking? Authorizing Provider  atorvastatin (LIPITOR) 40 MG tablet Take 40 mg by mouth daily.   Yes Historical Provider, MD  OMEGA 3 1000 MG CAPS Take 3,000 mg by mouth daily.   Yes Historical Provider, MD    Physical Exam: Filed Vitals:   05/11/16 1215 05/11/16 1230 05/11/16 1245 05/11/16 1302  BP: 105/74 130/76 127/78 113/74  Pulse: 61 117 63 69  Temp:      TempSrc:      Resp: 13 16 16 20   SpO2: 97% 96% 99% 97%    Constitutional:  . Appears calm and comfortable Eyes:  . PERRL and irises appear normal ENMT:  . external ears, nose appear normal Neck:  Neck is supple. No JVD.  Respiratory:  . CTA bilaterally, no w/r/r.  Cardiovascular:  . S1S2, irregular . No LE extremity edema   Abdomen:  Abdomen is soft and non tender.   Neurologic:  . Patient is awake and alert. Patient moves all limbs  Wt Readings from Last 3 Encounters:  05/09/16 84.823 kg (187 lb)  04/23/16 85.095 kg (187 lb 9.6 oz)  02/18/13 83.008 kg (183 lb)    I have personally reviewed following labs and imaging studies  Labs on Admission:  CBC:  Recent Labs Lab 05/11/16 1055  WBC 8.1  NEUTROABS 4.2  HGB 15.8  HCT 45.9  MCV 89.1  PLT [id number]   Basic Metabolic Panel:  Recent Labs Lab 05/11/16 1055  NA 141  K 4.5  CL 108  CO2 26  GLUCOSE 112*  BUN 20  CREATININE 1.04  CALCIUM 9.6   Liver Function Tests: No results for input(s): AST, ALT, ALKPHOS, BILITOT, PROT, ALBUMIN in the last 168 hours. No results for input(s): LIPASE, AMYLASE in the last 168 hours. No results for input(s): AMMONIA in the last 168 hours. Coagulation Profile: No results for  input(s): INR, PROTIME in the last 168 hours. Cardiac Enzymes:  Recent Labs Lab 05/11/16 1055  TROPONINI 0.03   BNP (last 3 results) No results for input(s): PROBNP in the last 8760 hours. HbA1C: No results for input(s): HGBA1C in the last 72 hours. CBG: No results for input(s): GLUCAP in the last 168 hours. Lipid Profile: No results for input(s): CHOL, HDL, LDLCALC, TRIG, CHOLHDL, LDLDIRECT in the last 72 hours. Thyroid Function Tests: No results for input(s): TSH, T4TOTAL, FREET4, T3FREE, THYROIDAB in the last 72 hours. Anemia Panel: No results for input(s): VITAMINB12, FOLATE, FERRITIN, TIBC, IRON, RETICCTPCT in the last 72 hours. Urine analysis: No results found for: COLORURINE, APPEARANCEUR, LABSPEC, PHURINE, GLUCOSEU, HGBUR, BILIRUBINUR, KETONESUR, PROTEINUR, UROBILINOGEN, NITRITE, LEUKOCYTESUR Sepsis Labs: @LABRCNTIP (procalcitonin:4,lacticidven:4) )No results found for this or any previous visit (from the past 240 hour(s)).    Radiological Exams on Admission: Dg Chest Port 1 View  05/11/2016  CLINICAL DATA:  Tachyarrhythmia. EXAM: PORTABLE CHEST 1 VIEW COMPARISON:  Abdomen CT dated 02/16/2014. FINDINGS: Grossly normal sized heart. Mildly prominent pulmonary vasculature and interstitial markings. The interstitial markings were mildly prominent at the lung bases on the previous abdomen CT. No pleural fluid. Thoracic spine degenerative changes. IMPRESSION: Mild pulmonary vascular congestion and mild interstitial lung disease, most likely chronic. Electronically Signed   By: Claudie Revering M.D.   On: 05/11/2016 11:09    EKG: Independently reviewed.   Active Problems:   Atrial fibrillation with rapid ventricular response (HCC)   Atrial fibrillation (HCC)   Assessment/Plan 1. Atrial fibrillation RVR 2. Alcohol use, cannot rule out dependence or abuse 3. Tobacco use   Admit patient to step down  ECHO  TSH  D Dimer  Urine drug screening  Continue cardizem  drip  Cardiology consult  Cycle cardiac enzymes  Aspirin  Benzodiazepines PRN (Monitor closely for alcohol withdrawal)  DVT prophylaxis: Lovenox Code Status: Full Family Communication:  Disposition Plan: Home   Consults called: Cardiology    Admission status: Inpatient    Time spent: 60 minutes  Dana Allan, MD  [facility] Pager #: [phone] 7PM-7AM contact night coverage as above   05/11/2016, 1:28 PM

## 2016-05-11 NOTE — Progress Notes (Signed)
PT converted from A.fib RVR to NSR at 1334. MD notified and per MD continue pt on gtt for now. No orders for PO Cardizem at this time. HR ranging from 60-70 in NSR. BP currently stable. Will continue to monitor.

## 2016-05-12 ENCOUNTER — Inpatient Hospital Stay (HOSPITAL_COMMUNITY): Payer: BLUE CROSS/BLUE SHIELD

## 2016-05-12 ENCOUNTER — Other Ambulatory Visit: Payer: Self-pay | Admitting: Cardiology

## 2016-05-12 ENCOUNTER — Telehealth: Payer: Self-pay | Admitting: *Deleted

## 2016-05-12 DIAGNOSIS — I480 Paroxysmal atrial fibrillation: Secondary | ICD-10-CM

## 2016-05-12 DIAGNOSIS — I4891 Unspecified atrial fibrillation: Secondary | ICD-10-CM

## 2016-05-12 LAB — CBC
HCT: 41.6 % (ref 39.0–52.0)
Hemoglobin: 14 g/dL (ref 13.0–17.0)
MCH: 30.6 pg (ref 26.0–34.0)
MCHC: 33.7 g/dL (ref 30.0–36.0)
MCV: 90.8 fL (ref 78.0–100.0)
Platelets: 161 10*3/uL (ref 150–400)
RBC: 4.58 MIL/uL (ref 4.22–5.81)
RDW: 13.4 % (ref 11.5–15.5)
WBC: 6.4 10*3/uL (ref 4.0–10.5)

## 2016-05-12 LAB — ECHOCARDIOGRAM COMPLETE
E decel time: 356 msec
E/e' ratio: 6.18
FS: 23 % — AB (ref 28–44)
Height: 72 in
IVS/LV PW RATIO, ED: 1.12
LA ID, A-P, ES: 38 mm
LA diam end sys: 38 mm
LA diam index: 1.83 cm/m2
LA vol A4C: 33.5 ml
LA vol index: 21.9 mL/m2
LA vol: 45.4 mL
LV E/e' medial: 6.18
LV E/e'average: 6.18
LV PW d: 9.44 mm — AB (ref 0.6–1.1)
LV e' LATERAL: 11.3 cm/s
LVOT area: 3.14 cm2
LVOT diameter: 20 mm
MV Dec: 356
MV pk A vel: 50.4 m/s
MV pk E vel: 69.8 m/s
TDI e' lateral: 11.3
TDI e' medial: 9.68
Weight: 2959.46 oz

## 2016-05-12 LAB — TROPONIN I
Troponin I: 0.03 ng/mL (ref <0.031)
Troponin I: 0.03 ng/mL (ref <0.031)

## 2016-05-12 LAB — BASIC METABOLIC PANEL
Anion gap: 5 (ref 5–15)
BUN: 21 mg/dL — ABNORMAL HIGH (ref 6–20)
CO2: 25 mmol/L (ref 22–32)
Calcium: 8.7 mg/dL — ABNORMAL LOW (ref 8.9–10.3)
Chloride: 106 mmol/L (ref 101–111)
Creatinine, Ser: 0.97 mg/dL (ref 0.61–1.24)
GFR calc Af Amer: >60 mL/min (ref >60)
GFR calc non Af Amer: >60 mL/min (ref >60)
Glucose, Bld: 102 mg/dL — ABNORMAL HIGH (ref 65–99)
Potassium: 3.8 mmol/L (ref 3.5–5.1)
Sodium: 136 mmol/L (ref 135–145)

## 2016-05-12 MED ORDER — METOPROLOL TARTRATE 25 MG PO TABS: 25.0000 mg | ORAL_TABLET | Freq: Two times a day (BID) | ORAL | Status: DC | PRN

## 2016-05-12 MED ORDER — ASPIRIN 81 MG PO TBEC: 81.0000 mg | DELAYED_RELEASE_TABLET | Freq: Every day | ORAL | Status: DC

## 2016-05-12 NOTE — Consult Note (Signed)
Patient ID: Brandon [name] MRN: [id number], DOB/AGE: [birth year]   Admit date: 05/11/2016   Reason for Consult: Atrial Fibrillation Requesting MD: Dr. Marthenia Rolling    Primary Physician: No primary care provider on file. Primary Cardiologist: New (Amarisa Wilinski)  Pt. Profile:  65 y/o male with a HLD, h/o palpitations, ETHO use and diverticulosis, admitted for symptomatic atrial fibrillation w/ RVR.   Problem List  Past Medical History  Diagnosis Date  . Hyperlipidemia   . Irregular heart beats     "for a while"  . Diverticulosis     Past Surgical History  Procedure Laterality Date  . Hemorrhoid surgery  1987  . External ear surgery  1997, 2000  . Appendectomy  1968     Allergies  Allergies  Allergen Reactions  . Orange Concentrate [Flavoring Agent] Shortness Of Breath and Swelling    Orange flavor dye    HPI  65 y/o male with  HLD, h/o palpitations, ETHO use and diverticulosis, admitted for symptomatic atrial fibrillation w/ RVR.   Symptoms started yesterday afternoon while he was out grocery shopping. He noted dizziness and chest pressure. He also admited to daily ETOH use and noted that he consumed a significant amount of alcohol the night prior. On arrival to the ED, he was noted to be in atrial fibrillation w/ RVR in the 150s. He was placed on IV Cardizem and rate improved. Cardiac enzymes were cycled x 3. Initial troponin was minimally abnormal at 0.04 but 2nd and 3rd troponins are both negative. CBC and BMP unremarkable. K is stable at 3.8. Mg also normal at 1.9. TSH WNL. CXR with mild pulmonary vascular congestion and mild interstitial lung disease, most likely chronic. BNP is normal at 59. UDS was negative.   He has converted to NSR with IV Cardizem. HR is well controlled in the 60s. Symptoms have resolved. He denies any prior h/o HTN, DM, Stroke/TIA, vascular disease or CHF. He denies every being diagnosed with atrial fibrillation, but notes that he has experienced  irregular heart beats/ palpitations frequently over the last several years. In addition to daily alcohol use, he drinks caffeine, ~ 1 pot of coffee a day.      Home Medications  Prior to Admission medications   Medication Sig Start Date End Date Taking? Authorizing Provider  atorvastatin (LIPITOR) 40 MG tablet Take 40 mg by mouth daily.   Yes Historical Provider, MD  OMEGA 3 1000 MG CAPS Take 3,000 mg by mouth daily.   Yes Historical Provider, MD    Family History  Family History  Problem Relation Age of Onset  . Diabetes Father   . Heart attack Brother   . Sudden death Brother   . Hyperlipidemia Neg Hx   . Hypertension Neg Hx   . Colon cancer Neg Hx     Social History  Social History   Social History  . Marital Status: Single    Spouse Name: N/A  . Number of Children: N/A  . Years of Education: N/A   Occupational History  . Not on file.   Social History Main Topics  . Smoking status: Current Some Day Smoker -- 0.25 packs/day  . Smokeless tobacco: Current User    Types: Snuff     Comment: smokes 3 cigarettes a day  . Alcohol Use: 8.4 oz/week    14 Glasses of wine per week  . Drug Use: No  . Sexual Activity: Not on file   Other Topics Concern  . Not on  file   Social History Narrative     Review of Systems General:  No chills, fever, night sweats or weight changes.  Cardiovascular:  No chest pain, dyspnea on exertion, edema, orthopnea, palpitations, paroxysmal nocturnal dyspnea. Dermatological: No rash, lesions/masses Respiratory: No cough, dyspnea Urologic: No hematuria, dysuria Abdominal:   No nausea, vomiting, diarrhea, bright red blood per rectum, melena, or hematemesis Neurologic:  No visual changes, wkns, changes in mental status. All other systems reviewed and are otherwise negative except as noted above.  Physical Exam  Blood pressure 140/73, pulse 60, temperature 97.3 F (36.3 C), temperature source Axillary, resp. rate 16, height 6' (1.829 m),  weight 184 lb 15.5 oz (83.9 kg), SpO2 100 %.  General: Pleasant, NAD Psych: Normal affect. Neuro: Alert and oriented X 3. Moves all extremities spontaneously. HEENT: Normal  Neck: Supple without bruits or JVD. Lungs:  Resp regular and unlabored, CTA. Heart: RRR no s3, s4, or murmurs. Abdomen: Soft, non-tender, non-distended, BS + x 4.  Extremities: No clubbing, cyanosis or edema. DP/PT/Radials 2+ and equal bilaterally.  Labs  Troponin (Point of Care Test) No results for input(s): TROPIPOC in the last 72 hours.  Recent Labs  05/11/16 1055 05/11/16 1750 05/12/16 0131 05/12/16 0738  TROPONINI 0.03 0.04* 0.03 0.03   Lab Results  Component Value Date   WBC 6.4 05/12/2016   HGB 14.0 05/12/2016   HCT 41.6 05/12/2016   MCV 90.8 05/12/2016   PLT 161 05/12/2016    Recent Labs Lab 05/12/16 0131  NA 136  K 3.8  CL 106  CO2 25  BUN 21*  CREATININE 0.97  CALCIUM 8.7*  GLUCOSE 102*   No results found for: CHOL, HDL, LDLCALC, TRIG Lab Results  Component Value Date   DDIMER <0.27 05/11/2016     Radiology/Studies  Dg Chest Port 1 View  05/11/2016  CLINICAL DATA:  Tachyarrhythmia. EXAM: PORTABLE CHEST 1 VIEW COMPARISON:  Abdomen CT dated 02/16/2014. FINDINGS: Grossly normal sized heart. Mildly prominent pulmonary vasculature and interstitial markings. The interstitial markings were mildly prominent at the lung bases on the previous abdomen CT. No pleural fluid. Thoracic spine degenerative changes. IMPRESSION: Mild pulmonary vascular congestion and mild interstitial lung disease, most likely chronic. Electronically Signed   By: Claudie Revering M.D.   On: 05/11/2016 11:09    ECG  Atrial fibrillation w/ RVR.  Telemetry currently- NSR. HR 60 bpm.   Echocardiogram - pending  ASSESSMENT AND PLAN  Active Problems:   Atrial fibrillation with rapid ventricular response (HCC)   Atrial fibrillation ([place])   1. Atrial Fibrillation w/ RVR: in the setting of increased alcohol  consumption, more than his baseline. He spontaneously converted to NSR with IV Cardizem. Symptoms have resolved. K, Mg, TSH, CBC, BMP and CXR are all unremarkable. EKG in NSR shows no ischemia. He denies any recent anginal symptomatology. 2D echo pending. Initial toponin is abnormal x 1, but subsequent troponins have been negative. Suspect demand ischemia from rapid afib.  This is his first documented case of afib, however he has experienced irregular heart beats frequently over the years. Would recommend outpatient 30 day monitor to assess for recurrent atrial fibrillation. If recurrence, will need address rate vs rhythm control stategy. Given his relatively young age and overall health condition, he would be a great candidate for ablation if evidence of recurrent afib. For now, would recommend lifestyle modification/ avoidance of triggers (ETOH and caffeine). His CHA2DS2 VASc score is 0 and recent episode of afib was <48 hrs, thus  no indication for anticoagulation at this point. MD to follow with further recommendations.     LOS: 1 day    Brittainy M. Ladoris Gene 05/12/2016 9:25 AM   History and all data above reviewed.  Patient examined.  I agree with the findings as above.  Patient with a history of palpitations.  Now with atrial fib that converted after IV dilt.  He actually had a significant amount of alcohol yesterday at a neighborhood block party.   He has had this same feeling several times before but never this long or intense.  Otherwise no cardiac complaints.  The patient exam reveals COR:RRR  ,  Lungs: Clear  ,  Abd: Positive bowel sounds, no rebound no guarding, Ext No edema   .  All available labs, radiology testing, previous records reviewed. Agree with documented assessment and plan. Atrial fib:  Possible Holiday Heart.  At this point I don't think he needs an out patient event monitor.  I would like to have him scheduled for a POET (Plain Old Exercise Treadmill).  We will do this as an  outpatient.  He can go home today on metoprolol tartrate 25 mg po PRN palpitations.  I can see him back in about six weeks.    Jeneen Rinks Jaira Canady  12:07 PM  05/12/2016

## 2016-05-12 NOTE — Progress Notes (Signed)
  Echocardiogram 2D Echocardiogram has been performed.  Jennette Dubin 05/12/2016, 10:43 AM

## 2016-05-12 NOTE — Progress Notes (Signed)
PT was discharged to home. Discussed after visit summary with patient and patient verbalized understanding. Pt had all belongings with him and was escorted out via wheelchair. VSS and patient not in any acute distress.

## 2016-05-12 NOTE — Telephone Encounter (Signed)
Message left

## 2016-05-12 NOTE — Care Management Note (Signed)
Case Management Note  Patient Details  Name: FINLAY SALONEN MRN: [id number] Date of Birth: 23-Apr-1951  Subjective/Objective:                 A.fib with rvr and iv cardizem drip   Action/Plan: Date:  June12, 2017 Chart reviewed for concurrent status and case management needs. Will continue to follow the patient for changes and needs: Expected discharge date: [id number] Velva Harman, BSN, [place]  Expected Discharge Date:                  Expected Discharge Plan:  Home/Self Care  In-House Referral:  NA  Discharge planning Services  CM Consult  Post Acute Care Choice:  NA Choice offered to:  NA  DME Arranged:    DME Agency:     HH Arranged:    [place] Agency:     Status of Service:  In process, will continue to follow  Medicare Important Message Given:    Date Medicare IM Given:    Medicare IM give by:    Date Additional Medicare IM Given:    Additional Medicare Important Message give by:     If discussed at [place] of Stay Meetings, dates discussed:    Additional Comments:  Leeroy Cha, RN 05/12/2016, 10:05 AM

## 2016-05-12 NOTE — Discharge Summary (Signed)
Brandon [name], is a 65 y.o. male  DOB [birth year]  MRN [id number].  Admission date:  05/11/2016  Admitting Physician  Bonnell Public, MD  Discharge Date:  05/12/2016   Primary MD  No primary care provider on file.  Recommendations for primary care physician for things to follow:   Check outpatient EEG in a week, monitor blood pressure. Monitor alcohol intake.   Admission Diagnosis  Atrial fibrillation with rapid ventricular response (HCC) [I48.91]   Discharge Diagnosis  Atrial fibrillation with rapid ventricular response ([place]) [I48.91]     Active Problems:   Atrial fibrillation with rapid ventricular response (HCC)   Atrial fibrillation [facility])      Past Medical History  Diagnosis Date  . Hyperlipidemia   . Irregular heart beats     "for a while"  . Diverticulosis     Past Surgical History  Procedure Laterality Date  . Hemorrhoid surgery  1987  . External ear surgery  1997, 2000  . Appendectomy  1968       HPI  from the history and physical done on the day of admission:   64 year old Male with documented history of hyperlipidemia, "irregular heart beats" and diverticulosis. Patient reported experiencing dizziness and chest pressure while shopping at [employer] today. According to the patient, he drank a significant of alcohol yesterday and felt he was experiencing a hang over earlier. He reports daily alcohol use but was not very forthcoming with the fact that he may be an alcoholic. Due to severity of the symptoms, the patient to the ER and was found to be in atrial fibrillation with RVR (heart rate in the 150's). Patient is currently On Cardizem drip with significant improvement in the heart rate. No chest pain or pressures currently, no SOB, no fever or chills, no headache, no neck pain, no GI  symptoms and no urinary symptoms. Patient denied history of hypertension, denied history of prior CVA or thromboembolic or any cardiac disease.  ED Course: On Cardizem drip  Pertinent labs: Abnormal EKG EKG: Independently reviewed.  Imaging: independently reviewed.      Hospital Course:     1. A. fib with RVR. Paroxysmal versus holiday heart due to excessive alcohol consumption during a party. Seen by cardiology, converted to sinus rhythm after IV Cardizem, [place] vasc 2 score of 1. Echogram reviewed and stable with preserved EF and no wall motion normality, urine drug screen unremarkable, TSH stable, stable d-dimer, completely symptom free this morning, seen by cardiology cleared to be discharged home on as needed Lopressor for palpitations. He will be followed by cardiology outpatient for outpatient stress test. We'll place on aspirin 81 mg total seen by cardiology. When necessary Lopressor prescribed.  2. Alcohol use. Gives inconsistent history. Counseled to quit any excessive alcohol intake, counseled to stop smoking.  3. Dyslipidemia. Continue home regimen.       Follow UP  Follow-up Information    Follow up with PCP. Schedule an appointment as soon as possible for a visit  in 1 week.      Follow up with Minus Breeding, MD. Schedule an appointment as soon as possible for a visit in 1 week.   Specialty:  Cardiology   Contact information:   [address] [place] [address] [phone]        Consults obtained - Cards  Discharge Condition: Stable  Diet and Activity recommendation: See Discharge Instructions below  Discharge Instructions       Discharge Instructions    Diet - low sodium heart healthy    Complete by:  As directed      Discharge instructions    Complete by:  As directed   Follow with Primary MD  in 7 days   Get CBC, CMP, 2 view Chest X ray checked  by Primary MD next visit.    Activity: As tolerated with Full fall precautions use  walker/cane & assistance as needed   Disposition Home    Diet:   Heart Healthy  .  For Heart failure patients - Check your Weight same time everyday, if you gain over 2 pounds, or you develop in leg swelling, experience more shortness of breath or chest pain, call your Primary MD immediately. Follow Cardiac Low Salt Diet and 1.5 lit/day fluid restriction.   On your next visit with your primary care physician please Get Medicines reviewed and adjusted.   Please request your Prim.MD to go over all Hospital Tests and Procedure/Radiological results at the follow up, please get all Hospital records sent to your Prim MD by signing hospital release before you go home.   If you experience worsening of your admission symptoms, develop shortness of breath, life threatening emergency, suicidal or homicidal thoughts you must seek medical attention immediately by calling 911 or calling your MD immediately  if symptoms less severe.  You Must read complete instructions/literature along with all the possible adverse reactions/side effects for all the Medicines you take and that have been prescribed to you. Take any new Medicines after you have completely understood and accpet all the possible adverse reactions/side effects.   Do not drive, operate heavy machinery, perform activities at heights, swimming or participation in water activities or provide baby sitting services if your were admitted for syncope or siezures until you have seen by Primary MD or a Neurologist and advised to do so again.  Do not drive when taking Pain medications.    Do not take more than prescribed Pain, Sleep and Anxiety Medications  Special Instructions: If you have smoked or chewed Tobacco  in the last 2 yrs please stop smoking, stop any regular Alcohol  and or any Recreational drug use.  Wear Seat belts while driving.   Please note  You were cared for by a hospitalist during your hospital stay. If you have any  questions about your discharge medications or the care you received while you were in the hospital after you are discharged, you can call the unit and asked to speak with the hospitalist on call if the hospitalist that took care of you is not available. Once you are discharged, your primary care physician will handle any further medical issues. Please note that NO REFILLS for any discharge medications will be authorized once you are discharged, as it is imperative that you return to your primary care physician (or establish a relationship with a primary care physician if you do not have one) for your aftercare needs so that they can reassess your need for medications and monitor  your lab values.     Increase activity slowly    Complete by:  As directed              Discharge Medications       Medication List    TAKE these medications        aspirin 81 MG EC tablet  Take 1 tablet (81 mg total) by mouth daily.     atorvastatin 40 MG tablet  Commonly known as:  LIPITOR  Take 40 mg by mouth daily.     metoprolol tartrate 25 MG tablet  Commonly known as:  LOPRESSOR  Take 1 tablet (25 mg total) by mouth 2 (two) times daily as needed (palpitations).     Omega 3 1000 MG Caps  Take 3,000 mg by mouth daily.        Major procedures and Radiology Reports - PLEASE review detailed and final reports for all details, in brief -   TTE  Left ventricle: The cavity size was normal. Systolic function was normal. The estimated ejection fraction was in the range of 60% to 65%. Wall motion was normal; there were no regional wall motion abnormalities. Left ventricular diastolic function parameters were normal. - Aorta: Aortic root dimension: 38 mm (ED). - Ascending aorta: The ascending aorta was mildly dilated. - Mitral valve: There was trivial regurgitation. - Right atrium: The atrium was mildly dilated.   Dg Chest Port 1 View  05/11/2016  CLINICAL DATA:  Tachyarrhythmia. EXAM: PORTABLE CHEST 1  VIEW COMPARISON:  Abdomen CT dated 02/16/2014. FINDINGS: Grossly normal sized heart. Mildly prominent pulmonary vasculature and interstitial markings. The interstitial markings were mildly prominent at the lung bases on the previous abdomen CT. No pleural fluid. Thoracic spine degenerative changes. IMPRESSION: Mild pulmonary vascular congestion and mild interstitial lung disease, most likely chronic. Electronically Signed   By: Claudie Revering M.D.   On: 05/11/2016 11:09    Micro Results      Recent Results (from the past 240 hour(s))  MRSA PCR Screening     Status: None   Collection Time: 05/11/16  8:48 PM  Result Value Ref Range Status   MRSA by PCR NEGATIVE NEGATIVE Final    Comment:        The GeneXpert MRSA Assay (FDA approved for NASAL specimens only), is one component of a comprehensive MRSA colonization surveillance program. It is not intended to diagnose MRSA infection nor to guide or monitor treatment for MRSA infections.     Today   Subjective    Brandon [name] today has no headache,no chest abdominal pain,no new weakness tingling or numbness, feels much better wants to go home today.     Objective   Blood pressure 134/80, pulse 58, temperature 97.3 F (36.3 C), temperature source Axillary, resp. rate 19, height 6' (1.829 m), weight 83.9 kg (184 lb 15.5 oz), SpO2 99 %.   Intake/Output Summary (Last 24 hours) at 05/12/16 1221 Last data filed at 05/12/16 0754  Gross per 24 hour  Intake   1559 ml  Output   1425 ml  Net    134 ml    Exam Awake Alert, Oriented x 3, No new F.N deficits, Normal affect [place].AT,PERRAL Supple Neck,No JVD, No cervical lymphadenopathy appriciated.  Symmetrical Chest wall movement, Good air movement bilaterally, CTAB RRR,No Gallops,Rubs or new Murmurs, No Parasternal Heave +ve B.Sounds, Abd Soft, Non tender, No organomegaly appriciated, No rebound -guarding or rigidity. No Cyanosis, Clubbing or edema, No new Rash or bruise  Data Review     CBC w Diff: Lab Results  Component Value Date   WBC 6.4 05/12/2016   HGB 14.0 05/12/2016   HCT 41.6 05/12/2016   PLT 161 05/12/2016   LYMPHOPCT 37 05/11/2016   MONOPCT 11 05/11/2016   EOSPCT 1 05/11/2016   BASOPCT 0 05/11/2016    CMP: Lab Results  Component Value Date   NA 136 05/12/2016   K 3.8 05/12/2016   CL 106 05/12/2016   CO2 25 05/12/2016   BUN 21* 05/12/2016   CREATININE 0.97 05/12/2016  .   Total Time in preparing paper work, data evaluation and todays exam - 35 minutes  Thurnell Lose M.D on 05/12/2016 at 12:21 PM  [facility]   Office  [phone]

## 2016-05-12 NOTE — Discharge Instructions (Signed)
Follow with Primary MD  in 7 days   Get CBC, CMP, 2 view Chest X ray checked  by Primary MD next visit.    Activity: As tolerated with Full fall precautions use walker/cane & assistance as needed   Disposition Home    Diet:   Heart Healthy  .  For Heart failure patients - Check your Weight same time everyday, if you gain over 2 pounds, or you develop in leg swelling, experience more shortness of breath or chest pain, call your Primary MD immediately. Follow Cardiac Low Salt Diet and 1.5 lit/day fluid restriction.   On your next visit with your primary care physician please Get Medicines reviewed and adjusted.   Please request your Prim.MD to go over all Hospital Tests and Procedure/Radiological results at the follow up, please get all Hospital records sent to your Prim MD by signing hospital release before you go home.   If you experience worsening of your admission symptoms, develop shortness of breath, life threatening emergency, suicidal or homicidal thoughts you must seek medical attention immediately by calling 911 or calling your MD immediately  if symptoms less severe.  You Must read complete instructions/literature along with all the possible adverse reactions/side effects for all the Medicines you take and that have been prescribed to you. Take any new Medicines after you have completely understood and accpet all the possible adverse reactions/side effects.   Do not drive, operate heavy machinery, perform activities at heights, swimming or participation in water activities or provide baby sitting services if your were admitted for syncope or siezures until you have seen by Primary MD or a Neurologist and advised to do so again.  Do not drive when taking Pain medications.    Do not take more than prescribed Pain, Sleep and Anxiety Medications  Special Instructions: If you have smoked or chewed Tobacco  in the last 2 yrs please stop smoking, stop any regular Alcohol  and or  any Recreational drug use.  Wear Seat belts while driving.   Please note  You were cared for by a hospitalist during your hospital stay. If you have any questions about your discharge medications or the care you received while you were in the hospital after you are discharged, you can call the unit and asked to speak with the hospitalist on call if the hospitalist that took care of you is not available. Once you are discharged, your primary care physician will handle any further medical issues. Please note that NO REFILLS for any discharge medications will be authorized once you are discharged, as it is imperative that you return to your primary care physician (or establish a relationship with a primary care physician if you do not have one) for your aftercare needs so that they can reassess your need for medications and monitor your lab values.

## 2016-05-13 ENCOUNTER — Encounter (HOSPITAL_COMMUNITY): Payer: Self-pay | Admitting: Cardiology

## 2016-05-20 ENCOUNTER — Encounter: Payer: Self-pay | Admitting: Gastroenterology

## 2016-05-21 ENCOUNTER — Encounter (HOSPITAL_COMMUNITY): Payer: BLUE CROSS/BLUE SHIELD

## 2016-05-21 ENCOUNTER — Telehealth (HOSPITAL_COMMUNITY): Payer: Self-pay

## 2016-05-21 NOTE — Telephone Encounter (Signed)
Encounter complete. 

## 2016-05-23 ENCOUNTER — Ambulatory Visit (HOSPITAL_COMMUNITY)
Admission: RE | Admit: 2016-05-23 | Discharge: 2016-05-23 | Disposition: A | Discharge status: Home / Self Care | Payer: BLUE CROSS/BLUE SHIELD | Source: Ambulatory Visit | Attending: Cardiology | Admitting: Cardiology

## 2016-05-23 DIAGNOSIS — I480 Paroxysmal atrial fibrillation: Secondary | ICD-10-CM

## 2016-05-23 LAB — EXERCISE TOLERANCE TEST
CSEPHR: 95 %
CSEPPHR: 151 {beats}/min
Estimated workload: 13.4 METS
Exercise duration (min): 12 min
MPHR: 158 {beats}/min
RPE: 16
Rest HR: 71 {beats}/min

## 2016-05-29 ENCOUNTER — Ambulatory Visit: Payer: BLUE CROSS/BLUE SHIELD | Admitting: Cardiology

## 2016-07-21 NOTE — Progress Notes (Signed)
Cardiology Office Note   Date:  07/24/2016   ID:  Brandon [name], DOB 03/02/1951, MRN [id number]  PCP:  Donnie Coffin, MD  Cardiologist:   Minus Breeding, MD   Chief Complaint  Patient presents with  . Atrial Fibrillation      History of Present Illness: Brandon [name] is a 65 y.o. male who presents for evaluation of atrial fib.  I saw him in consultation in June.  This happened the day after significant EtOH at a neighborhood block party.  Since I last saw him he has done well.  He has had no further tachypalpitations. He is quite active and he denies any cardiovascular symptoms. The patient denies any new symptoms such as chest discomfort, neck or arm discomfort. There has been no new shortness of breath, PND or orthopnea. There have been no reported palpitations, presyncope or syncope.    Past Medical History:  Diagnosis Date  . Diverticulosis   . Hyperlipidemia   . Irregular heart beats    "for a while"    Past Surgical History:  Procedure Laterality Date  . APPENDECTOMY  1968  . EXTERNAL EAR SURGERY  1997, 2000  . HEMORRHOID SURGERY  1987     Current Outpatient Prescriptions  Medication Sig Dispense Refill  . aspirin EC 81 MG EC tablet Take 1 tablet (81 mg total) by mouth daily. 30 tablet 0  . atorvastatin (LIPITOR) 40 MG tablet Take 40 mg by mouth daily.    . OMEGA 3 1000 MG CAPS Take 3,000 mg by mouth daily.     No current facility-administered medications for this visit.     Allergies:   Orange concentrate [flavoring agent]    ROS:  Please see the history of present illness.   Otherwise, review of systems are positive for none.   All other systems are reviewed and negative.    PHYSICAL EXAM: VS:  BP 116/74   Pulse 64   Ht 6' (1.829 m)   Wt 188 lb (85.3 kg)   BMI 25.50 kg/m  , BMI Body mass index is 25.5 kg/m. GENERAL:  Well appearing NECK:  No jugular venous distention, waveform within normal limits, carotid upstroke brisk and symmetric, no  bruits, no thyromegaly LUNGS:  Clear to auscultation bilaterally CHEST:  Unremarkable HEART:  PMI not displaced or sustained,S1 and S2 within normal limits, no S3, no S4, no clicks, no rubs, no murmurs ABD:  Flat, positive bowel sounds normal in frequency in pitch, no bruits, no rebound, no guarding, no midline pulsatile mass, no hepatomegaly, no splenomegaly EXT:  2 plus pulses throughout, no edema, no cyanosis no clubbing   EKG:  EKG is not ordered today.   Recent Labs: 05/11/2016: B Natriuretic Peptide 59.5; Magnesium 1.9; TSH 0.763 05/12/2016: BUN 21; Creatinine, Ser 0.97; Hemoglobin 14.0; Platelets 161; Potassium 3.8; Sodium 136    Lipid Panel No results found for: CHOL, TRIG, HDL, CHOLHDL, VLDL, LDLCALC, LDLDIRECT    Wt Readings from Last 3 Encounters:  07/24/16 188 lb (85.3 kg)  05/11/16 184 lb 15.5 oz (83.9 kg)  05/09/16 187 lb (84.8 kg)      Other studies Reviewed: Additional studies/ records that were reviewed today include: POET (Plain Old Exercise Treadmill). Review of the above records demonstrates:  Please see elsewhere in the note.     ASSESSMENT AND PLAN:  ATRIAL FIB:   CHA2DS2 VASc score is 0 .  We had a long discussion about this. If he has atrial fibrillation  future I would suggest presenting to the emergency room for flecainide pill in pocket bolus and observation. If that is successful then he could do this at home for future.  ALCOHOL USE:   This may be a trigger for his fibrillation we discussed using this in moderation.  ABNORMAL TROP:  He had a negative POET (Plain Old Exercise Treadmill).   No further evaluation is planned.  Current medicines are reviewed at length with the patient today.  The patient does not have concerns regarding medicines.  The following changes have been made:  no change  Labs/ tests ordered today include:  No orders of the defined types were placed in this encounter.    Disposition:   FU with me as needed.      Signed, Minus Breeding, MD  07/24/2016 5:17 PM    [place]

## 2016-07-24 ENCOUNTER — Ambulatory Visit (INDEPENDENT_AMBULATORY_CARE_PROVIDER_SITE_OTHER): Payer: BLUE CROSS/BLUE SHIELD | Admitting: Cardiology

## 2016-07-24 ENCOUNTER — Encounter (INDEPENDENT_AMBULATORY_CARE_PROVIDER_SITE_OTHER): Payer: Self-pay

## 2016-07-24 ENCOUNTER — Encounter: Payer: Self-pay | Admitting: Cardiology

## 2016-07-24 VITALS — BP 116/74 | HR 64 | Ht 72.0 in | Wt 188.0 lb

## 2016-07-24 DIAGNOSIS — I480 Paroxysmal atrial fibrillation: Secondary | ICD-10-CM

## 2016-07-24 NOTE — Patient Instructions (Addendum)
Your physician recommends that you schedule a follow-up appointment in: As Needed    

## 2016-07-25 ENCOUNTER — Encounter: Payer: Self-pay | Admitting: Cardiology

## 2016-11-14 DIAGNOSIS — J310 Chronic rhinitis: Secondary | ICD-10-CM | POA: Diagnosis not present

## 2016-11-14 DIAGNOSIS — J069 Acute upper respiratory infection, unspecified: Secondary | ICD-10-CM | POA: Diagnosis not present

## 2016-12-22 DIAGNOSIS — H918X2 Other specified hearing loss, left ear: Secondary | ICD-10-CM | POA: Diagnosis not present

## 2017-01-05 DIAGNOSIS — L111 Transient acantholytic dermatosis [Grover]: Secondary | ICD-10-CM | POA: Diagnosis not present

## 2017-03-30 DIAGNOSIS — Z0000 Encounter for general adult medical examination without abnormal findings: Secondary | ICD-10-CM | POA: Diagnosis not present

## 2017-03-30 DIAGNOSIS — E7800 Pure hypercholesterolemia, unspecified: Secondary | ICD-10-CM | POA: Diagnosis not present

## 2017-03-30 DIAGNOSIS — Z125 Encounter for screening for malignant neoplasm of prostate: Secondary | ICD-10-CM | POA: Diagnosis not present

## 2017-04-06 DIAGNOSIS — H9113 Presbycusis, bilateral: Secondary | ICD-10-CM | POA: Diagnosis not present

## 2017-04-06 DIAGNOSIS — H90A32 Mixed conductive and sensorineural hearing loss, unilateral, left ear with restricted hearing on the contralateral side: Secondary | ICD-10-CM | POA: Diagnosis not present

## 2017-04-06 DIAGNOSIS — H9072 Mixed conductive and sensorineural hearing loss, unilateral, left ear, with unrestricted hearing on the contralateral side: Secondary | ICD-10-CM | POA: Diagnosis not present

## 2017-04-06 DIAGNOSIS — H90A21 Sensorineural hearing loss, unilateral, right ear, with restricted hearing on the contralateral side: Secondary | ICD-10-CM | POA: Diagnosis not present

## 2017-04-30 DIAGNOSIS — L821 Other seborrheic keratosis: Secondary | ICD-10-CM | POA: Diagnosis not present

## 2017-04-30 DIAGNOSIS — L814 Other melanin hyperpigmentation: Secondary | ICD-10-CM | POA: Diagnosis not present

## 2017-04-30 DIAGNOSIS — D1801 Hemangioma of skin and subcutaneous tissue: Secondary | ICD-10-CM | POA: Diagnosis not present

## 2017-04-30 DIAGNOSIS — D225 Melanocytic nevi of trunk: Secondary | ICD-10-CM | POA: Diagnosis not present

## 2017-05-04 ENCOUNTER — Emergency Department (HOSPITAL_COMMUNITY)
Admission: EM | Admit: 2017-05-04 | Discharge: 2017-05-04 | Disposition: A | Discharge status: Home / Self Care | Payer: BLUE CROSS/BLUE SHIELD | Attending: Emergency Medicine | Admitting: Emergency Medicine

## 2017-05-04 ENCOUNTER — Encounter (HOSPITAL_COMMUNITY): Payer: Self-pay | Admitting: Emergency Medicine

## 2017-05-04 DIAGNOSIS — R42 Dizziness and giddiness: Secondary | ICD-10-CM | POA: Diagnosis present

## 2017-05-04 DIAGNOSIS — F17210 Nicotine dependence, cigarettes, uncomplicated: Secondary | ICD-10-CM | POA: Diagnosis not present

## 2017-05-04 DIAGNOSIS — Z7982 Long term (current) use of aspirin: Secondary | ICD-10-CM | POA: Insufficient documentation

## 2017-05-04 DIAGNOSIS — H8110 Benign paroxysmal vertigo, unspecified ear: Secondary | ICD-10-CM | POA: Diagnosis not present

## 2017-05-04 LAB — CBC
HCT: 46.5 % (ref 39.0–52.0)
Hemoglobin: 15.8 g/dL (ref 13.0–17.0)
MCH: 30.9 pg (ref 26.0–34.0)
MCHC: 34 g/dL (ref 30.0–36.0)
MCV: 90.8 fL (ref 78.0–100.0)
PLATELETS: 165 10*3/uL (ref 150–400)
RBC: 5.12 MIL/uL (ref 4.22–5.81)
RDW: 13.1 % (ref 11.5–15.5)
WBC: 7.1 10*3/uL (ref 4.0–10.5)

## 2017-05-04 LAB — BASIC METABOLIC PANEL
Anion gap: 13 (ref 5–15)
BUN: 20 mg/dL (ref 6–20)
CALCIUM: 9.4 mg/dL (ref 8.9–10.3)
CO2: 22 mmol/L (ref 22–32)
CREATININE: 0.95 mg/dL (ref 0.61–1.24)
Chloride: 105 mmol/L (ref 101–111)
GFR calc Af Amer: >60 mL/min (ref >60)
Glucose, Bld: 134 mg/dL — ABNORMAL HIGH (ref 65–99)
Potassium: 4.4 mmol/L (ref 3.5–5.1)
SODIUM: 140 mmol/L (ref 135–145)

## 2017-05-04 MED ORDER — DIAZEPAM 5 MG PO TABS: 5.0000 mg | ORAL_TABLET | Freq: Two times a day (BID) | ORAL | 0 refills | Status: DC | PRN

## 2017-05-04 MED ORDER — ONDANSETRON 4 MG PO TBDP
4.0000 mg | ORAL_TABLET | Freq: Once | ORAL | Status: AC | PRN
  Administered 2017-05-04: 4 mg via ORAL

## 2017-05-04 MED ORDER — ONDANSETRON 4 MG PO TBDP
ORAL_TABLET | ORAL | Status: AC
  Filled 2017-05-04: qty 1

## 2017-05-04 NOTE — ED Provider Notes (Signed)
[facility] DEPT Provider Note   CSN: [id number] Arrival date & time: 05/04/17  1228     History   Chief Complaint Chief Complaint  Patient presents with  . Dizziness  . Nausea    HPI Brandon [name] is a 66 y.o. male.  The history is provided by the patient and medical records. No language interpreter was used.  Dizziness  Quality:  Room spinning Severity:  Moderate Onset quality:  Gradual Duration:  8 hours Timing:  Intermittent Progression:  Unchanged Chronicity:  New Context: head movement and standing up   Relieved by:  Being still and lying down Worsened by:  Turning head and eye movement Ineffective treatments:  None tried Associated symptoms: hearing loss (Chronic decreased L ear hearing), nausea, tinnitus (baseline chronic L ear tinnitus) and vomiting   Associated symptoms: no chest pain, no palpitations and no shortness of breath     Past Medical History:  Diagnosis Date  . Diverticulosis   . Hyperlipidemia   . Irregular heart beats    "for a while"    Patient Active Problem List   Diagnosis Date Noted  . Atrial fibrillation with rapid ventricular response ([place]) 05/11/2016  . Atrial fibrillation ([place]) 05/11/2016  . Hamstring strain 01/26/2013  . Achilles tendinitis 12/15/2012  . PALPITATIONS 03/28/2010  . HYPERLIPIDEMIA 03/27/2010  . SMOKER 03/27/2010  . PREMATURE VENTRICULAR CONTRACTIONS 03/27/2010  . CHEST PAIN 03/27/2010    Past Surgical History:  Procedure Laterality Date  . APPENDECTOMY  1968  . EXTERNAL EAR SURGERY  1997, 2000  . [place] Medications    Prior to Admission medications   Medication Sig Start Date End Date Taking? Authorizing Provider  atorvastatin (LIPITOR) 40 MG tablet Take 40 mg by mouth daily.   Yes [provider]  OMEGA 3 1000 MG CAPS Take 2,000 mg by mouth daily.    Yes [provider]  aspirin EC 81 MG EC tablet Take 1 tablet (81 mg total) by mouth daily. Patient  not taking: Reported on 05/04/2017 05/12/16   Thurnell Lose, MD  diazepam (VALIUM) 5 MG tablet Take 1 tablet (5 mg total) by mouth every 12 (twelve) hours as needed (positional dizziness). 05/04/17   Payton Emerald, MD    Family History Family History  Problem Relation Age of Onset  . Diabetes Father   . Heart attack Brother   . Sudden death Brother   . Hyperlipidemia Neg Hx   . Hypertension Neg Hx   . Colon cancer Neg Hx     Social History Social History  Substance Use Topics  . Smoking status: Current Some Day Smoker    Packs/day: 0.25  . Smokeless tobacco: Current User    Types: Snuff     Comment: smokes 3 cigarettes a day  . Alcohol use 8.4 oz/week    14 Glasses of wine per week     Allergies   Orange concentrate [flavoring agent]   Review of Systems Review of Systems  Constitutional: Negative for chills and fever.  HENT: Positive for hearing loss (Chronic decreased L ear hearing) and tinnitus (baseline chronic L ear tinnitus). Negative for ear pain and sore throat.   Eyes: Negative for pain and visual disturbance.  Respiratory: Negative for cough and shortness of breath.   Cardiovascular: Negative for chest pain and palpitations.  Gastrointestinal: Positive for nausea and vomiting. Negative for abdominal pain.  Genitourinary: Negative for dysuria and hematuria.  Musculoskeletal: Negative for  arthralgias and back pain.  Skin: Negative for color change and rash.  Neurological: Positive for dizziness. Negative for seizures and syncope.  All other systems reviewed and are negative.    Physical Exam Updated Vital Signs BP 139/70   Pulse (!) 51   Resp 13   SpO2 100%   Physical Exam  Constitutional: He appears well-developed and well-nourished.  HENT:  Head: Normocephalic and atraumatic.  Eyes: Conjunctivae are normal.  Neck: Neck supple.  Cardiovascular: Normal rate and regular rhythm.   No murmur heard. Pulmonary/Chest: Effort normal and breath sounds normal.  No respiratory distress.  Abdominal: Soft. There is no tenderness.  Musculoskeletal: He exhibits no edema.  Neurological: He is alert. No cranial nerve deficit. Coordination normal.  5/5 motor strength and intact sensation in all extremities  Skin: Skin is warm and dry.  Nursing note and vitals reviewed.    ED Treatments / Results  Labs (all labs ordered are listed, but only abnormal results are displayed) Labs Reviewed  BASIC METABOLIC PANEL - Abnormal; Notable for the following:       Result Value   Glucose, Bld 134 (*)    All other components within normal limits  CBC    EKG  EKG Interpretation  Date/Time:  Monday May 04 2017 12:45:38 EDT Ventricular Rate:  63 PR Interval:  146 QRS Duration: 104 QT Interval:  420 QTC Calculation: 429 R Axis:   67 Text Interpretation:  Normal sinus rhythm Cannot rule out Inferior infarct , age undetermined Abnormal ECG Incomplete right bundle branch block since last tracing no significant change Confirmed by Noemi Chapel [phone]) on 05/04/2017 3:33:12 PM       Radiology No results found.  Procedures Procedures (including critical care time)  Medications Ordered in ED Medications  ondansetron (ZOFRAN-ODT) disintegrating tablet 4 mg (4 mg Oral Given 05/04/17 1256)     Initial Impression / Assessment and Plan / ED Course  I have reviewed the triage vital signs and the nursing notes.  Pertinent labs & imaging results that were available during my care of the patient were reviewed by me and considered in my medical decision making (see chart for details).     66 year old male history of hyperlipidemia and A. fib who presents with onset this morning of positional vertigo.  He reports room spinning sensation with position changes and head turning. Symptoms resolve with rest and closing eyes. No recent illnesses. Denies previous episodes of symptoms.  Reports chronic left ear tinnitus and decreased hearing. No acute worsening of ear  symptoms. Denies word finding or speech difficulty. No focal numbness or weakness. No previous h/o stroke. Has prior history of A. fib 1 year ago but is currently in sinus bradycardia. No new medications  AF, VSS. No focal neuro deficits. Finger-to-nose intact bilaterally. Vertiginous symptoms reproduced when sitting up. Dix-Hallpike negative bilaterally. Lungs CTAB. Abdomen soft, benign throughout.  CBC, BMP unremarkable. EKG showing sinus bradycardia.    Suspect BPPV. Doubt vestibular neurits, meniere dz, labyrinthitis. Less likely central process with reassuring neurologic exam and positional nature of symptoms. Pt tolerating PO and ambulates without assistance. Epley maneuvers and valium provided. He is stable for continued outpatient followup.  Return precautions provided for worsening symptoms. Pt will f/u with PCP at first availability. Pt verbalized agreement with plan.  Pt care d/w Dr. Sabra Heck  Final Clinical Impressions(s) / ED Diagnoses   Final diagnoses:  Benign paroxysmal positional vertigo, unspecified laterality    New Prescriptions Discharge Medication List as of  05/04/2017  3:52 PM    START taking these medications   Details  diazepam (VALIUM) 5 MG tablet Take 1 tablet (5 mg total) by mouth every 12 (twelve) hours as needed (positional dizziness)., Starting Mon 05/04/2017, Print         Payton Emerald, MD 05/04/17 0626    Noemi Chapel, MD 05/05/17 1003

## 2017-05-04 NOTE — ED Triage Notes (Addendum)
Pt reports he woke up this am feeling dizzy and like the room was spinning, states he went to bed around 12 last night and felt normal. Pt also reports nausea and vomiting. Dizziness improves when lying down with eyes closed. Pt a/ox4, speech clear, no neuro deficits noted.

## 2017-05-04 NOTE — ED Provider Notes (Signed)
I saw and evaluated the patient, reviewed the resident's note and I agree with the findings and plan.  Pertinent History: The patient is a 66 year old male, presents with vertigo that appeared when he woke up this morning when he tried to roll over in bed. This seems to be reproducible with certain changes of position, it is gradually eased off at this point the patient does not have any symptoms in the upright position. He does have a chronic tinnitus, he has no significant findings on exam including no weakness numbness or changes in vision or speech  Pertinent Exam findings: On exam the patient has a clear sensorium, follows commands without difficulty, speech is clear and goal-directed, there is no nystagmus, his membranes are clear bilaterally, coordination is normal. Cardiac exam is also unremarkable with a pulse of 60 and normal pulses at the radial artery, no peripheral edema.   Well-appearing, likely peripheral vertigo, no indication for now imaging, labs evaluated, EKG evaluated, patient stable for discharge, given precautions for return including signs of central vertigo, the patient expressed his understanding and agreement  I personally interpreted the EKG as well as the resident and agree with the interpretation on the resident's chart.   EKG Interpretation  Date/Time:  Monday May 04 2017 12:45:38 EDT Ventricular Rate:  63 PR Interval:  146 QRS Duration: 104 QT Interval:  420 QTC Calculation: 429 R Axis:   67 Text Interpretation:  Normal sinus rhythm Cannot rule out Inferior infarct , age undetermined Abnormal ECG Incomplete right bundle branch block since last tracing no significant change Confirmed by Noemi Chapel [phone]) on 05/04/2017 3:33:12 PM        Final diagnoses:  Benign paroxysmal positional vertigo, unspecified laterality      Noemi Chapel, MD 05/05/17 1002

## 2017-05-04 NOTE — ED Notes (Signed)
Called pt to go back to room and unable to locate pt in waiting room.  Will attempt again.

## 2017-05-04 NOTE — ED Notes (Signed)
Resident at bedside.  

## 2017-05-12 DIAGNOSIS — R42 Dizziness and giddiness: Secondary | ICD-10-CM | POA: Diagnosis not present

## 2017-05-14 DIAGNOSIS — H90A32 Mixed conductive and sensorineural hearing loss, unilateral, left ear with restricted hearing on the contralateral side: Secondary | ICD-10-CM | POA: Diagnosis not present

## 2017-05-17 IMAGING — DX DG CHEST 1V PORT
1 series · 1 of 1 positions shown · non-contrast
Comparison: Abdomen CT dated 02/16/2014.

CLINICAL DATA: Tachyarrhythmia.

EXAM:
PORTABLE CHEST 1 VIEW

[chest ap]
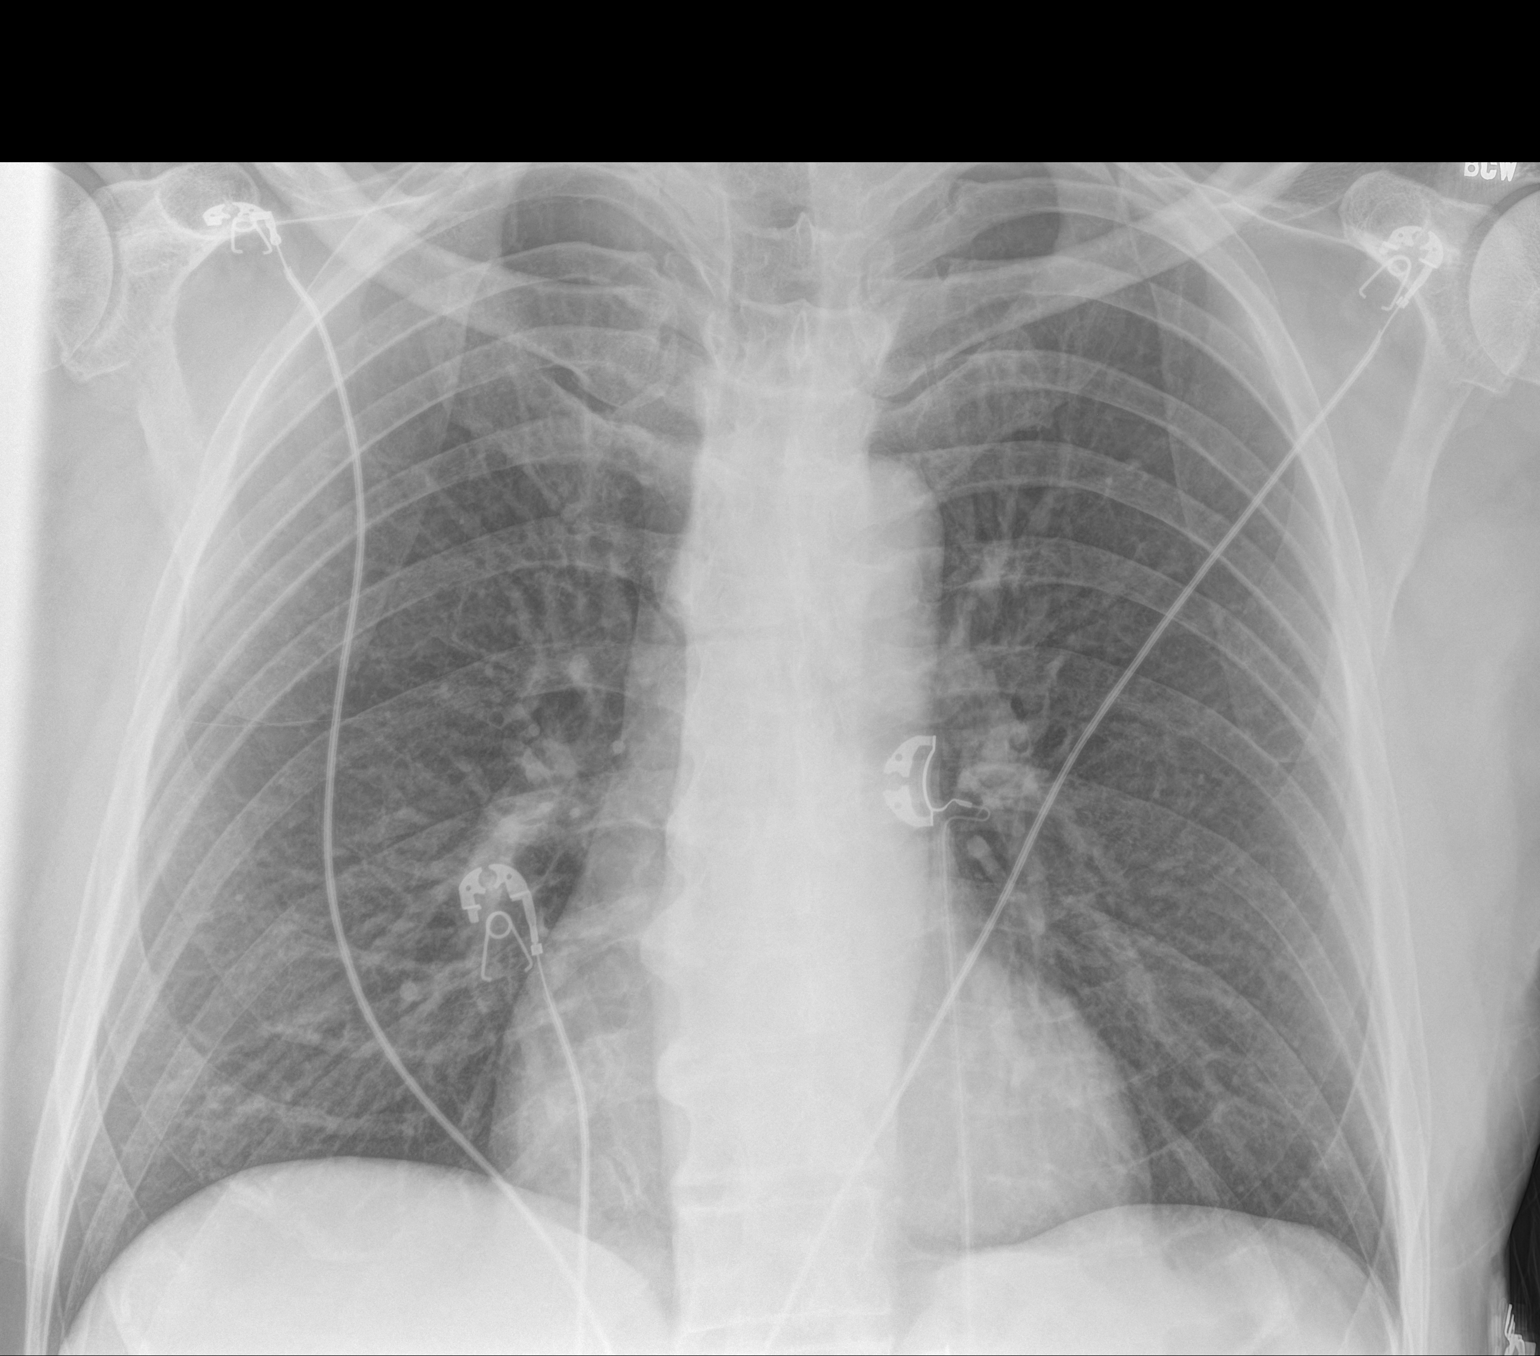

[1 of 1 positions shown; findings below may reference images not displayed]

FINDINGS: Grossly normal sized heart. Mildly prominent pulmonary vasculature
and interstitial markings. The interstitial markings were mildly
prominent at the lung bases on the previous abdomen CT. No pleural
fluid. Thoracic spine degenerative changes.
IMPRESSION: Mild pulmonary vascular congestion and mild interstitial lung
disease, most likely chronic.

## 2017-12-11 DIAGNOSIS — Z1159 Encounter for screening for other viral diseases: Secondary | ICD-10-CM | POA: Diagnosis not present

## 2017-12-11 DIAGNOSIS — R42 Dizziness and giddiness: Secondary | ICD-10-CM | POA: Diagnosis not present

## 2017-12-11 DIAGNOSIS — Z114 Encounter for screening for human immunodeficiency virus [HIV]: Secondary | ICD-10-CM | POA: Diagnosis not present

## 2017-12-14 ENCOUNTER — Other Ambulatory Visit: Payer: Self-pay | Admitting: Family Medicine

## 2017-12-14 DIAGNOSIS — R42 Dizziness and giddiness: Secondary | ICD-10-CM

## 2017-12-21 ENCOUNTER — Ambulatory Visit
Admission: RE | Admit: 2017-12-21 | Discharge: 2017-12-21 | Disposition: A | Discharge status: Home / Self Care | Payer: BLUE CROSS/BLUE SHIELD | Source: Ambulatory Visit | Attending: Family Medicine | Admitting: Family Medicine

## 2017-12-21 DIAGNOSIS — R42 Dizziness and giddiness: Secondary | ICD-10-CM

## 2017-12-21 MED ORDER — GADOBENATE DIMEGLUMINE 529 MG/ML IV SOLN
17.0000 mL | Freq: Once | INTRAVENOUS | Status: AC | PRN
  Administered 2017-12-21: 17 mL via INTRAVENOUS

## 2018-01-04 DIAGNOSIS — H8120 Vestibular neuronitis, unspecified ear: Secondary | ICD-10-CM | POA: Insufficient documentation

## 2018-04-01 DIAGNOSIS — E7800 Pure hypercholesterolemia, unspecified: Secondary | ICD-10-CM | POA: Diagnosis not present

## 2018-04-01 DIAGNOSIS — Z23 Encounter for immunization: Secondary | ICD-10-CM | POA: Diagnosis not present

## 2018-04-01 DIAGNOSIS — Z0000 Encounter for general adult medical examination without abnormal findings: Secondary | ICD-10-CM | POA: Diagnosis not present

## 2018-04-01 DIAGNOSIS — Z125 Encounter for screening for malignant neoplasm of prostate: Secondary | ICD-10-CM | POA: Diagnosis not present

## 2018-05-03 DIAGNOSIS — L821 Other seborrheic keratosis: Secondary | ICD-10-CM | POA: Diagnosis not present

## 2018-05-03 DIAGNOSIS — L814 Other melanin hyperpigmentation: Secondary | ICD-10-CM | POA: Diagnosis not present

## 2018-05-03 DIAGNOSIS — D225 Melanocytic nevi of trunk: Secondary | ICD-10-CM | POA: Diagnosis not present

## 2018-05-03 DIAGNOSIS — D1801 Hemangioma of skin and subcutaneous tissue: Secondary | ICD-10-CM | POA: Diagnosis not present

## 2018-07-19 DIAGNOSIS — Z23 Encounter for immunization: Secondary | ICD-10-CM | POA: Diagnosis not present

## 2018-08-05 DIAGNOSIS — M25461 Effusion, right knee: Secondary | ICD-10-CM | POA: Diagnosis not present

## 2018-08-05 DIAGNOSIS — M25561 Pain in right knee: Secondary | ICD-10-CM | POA: Diagnosis not present

## 2018-08-24 DIAGNOSIS — M25561 Pain in right knee: Secondary | ICD-10-CM | POA: Diagnosis not present

## 2018-09-20 DIAGNOSIS — M25561 Pain in right knee: Secondary | ICD-10-CM | POA: Diagnosis not present

## 2018-09-28 DIAGNOSIS — M25461 Effusion, right knee: Secondary | ICD-10-CM | POA: Diagnosis not present

## 2018-09-28 DIAGNOSIS — R600 Localized edema: Secondary | ICD-10-CM | POA: Diagnosis not present

## 2018-09-28 DIAGNOSIS — S83231A Complex tear of medial meniscus, current injury, right knee, initial encounter: Secondary | ICD-10-CM | POA: Diagnosis not present

## 2018-09-28 DIAGNOSIS — M76891 Other specified enthesopathies of right lower limb, excluding foot: Secondary | ICD-10-CM | POA: Diagnosis not present

## 2018-10-04 DIAGNOSIS — S83241D Other tear of medial meniscus, current injury, right knee, subsequent encounter: Secondary | ICD-10-CM | POA: Diagnosis not present

## 2018-10-07 DIAGNOSIS — S83241A Other tear of medial meniscus, current injury, right knee, initial encounter: Secondary | ICD-10-CM | POA: Insufficient documentation

## 2018-10-09 DIAGNOSIS — M79674 Pain in right toe(s): Secondary | ICD-10-CM | POA: Diagnosis not present

## 2018-10-21 DIAGNOSIS — S83241D Other tear of medial meniscus, current injury, right knee, subsequent encounter: Secondary | ICD-10-CM | POA: Diagnosis not present

## 2018-12-27 IMAGING — MR MR HEAD WO/W CM
12 series · 48 of 48 positions shown · IV contrast (multihance)
Comparison: None.

CLINICAL DATA: Dizziness and gait imbalance since 6202. History of
hyperlipidemia and atrial fibrillation.

EXAM:
MRI HEAD WITHOUT AND WITH CONTRAST
TECHNIQUE: Multiplanar, multiecho pulse sequences of the brain and surrounding
structures were obtained without and with intravenous contrast.
CONTRAST:  17 cc MultiHance

[Series 5: T1 · sagittal · 4.0mm · 0.75mm/px · 1 of 29 slices shown (1 of 3)]
[im 1/29]
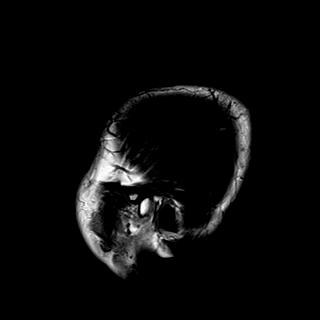

[Series 6: T2 · axial · 4.0mm · 0.36mm/px · z∈[-47,+90]mm · 2 of 28 slices shown]
[im 1/28]
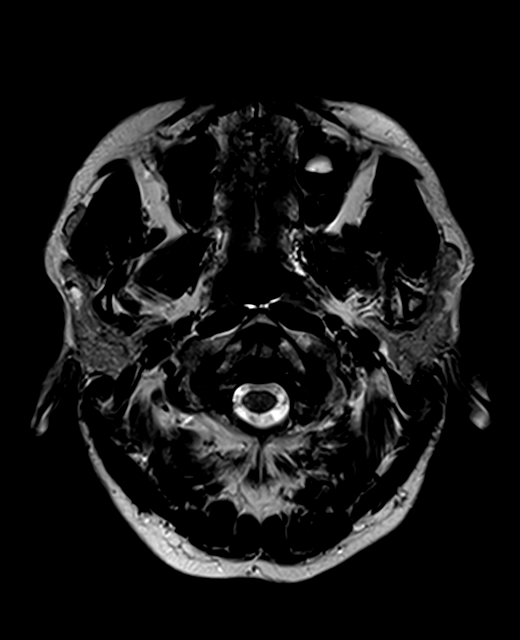
[im 28/28]
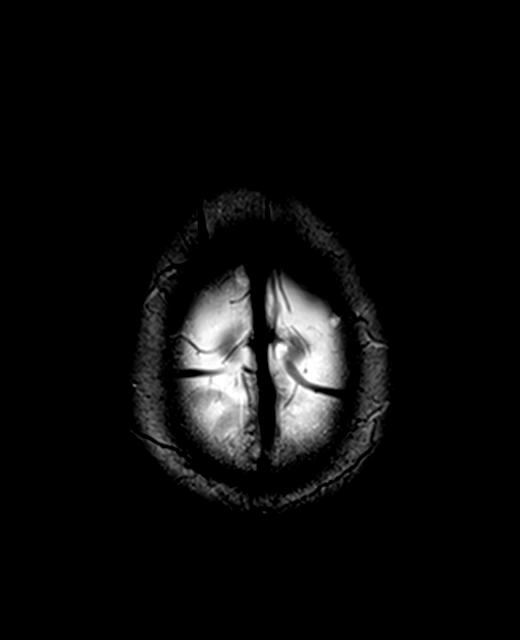

[Series 7: DWI · axial · 3.0mm · 1.44mm/px · z∈[-47,+92]mm · 6 of 88 slices shown (1 of 4)]
[im 1/88]
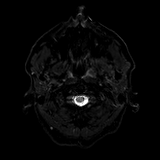
[im 18/88]
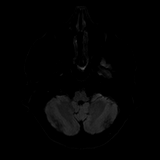
[im 35/88]
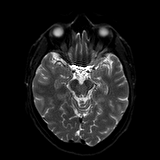
[im 53/88]
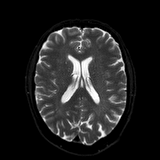
[im 70/88]
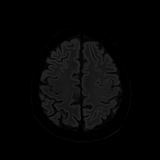
[im 88/88]
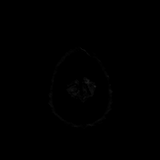

[Series 8: DWI · axial · 3.0mm · 1.44mm/px · z∈[-47,+92]mm · 3 of 44 slices shown (2 of 4)]
[im 1/44]
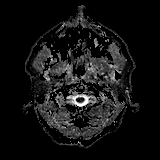
[im 22/44]
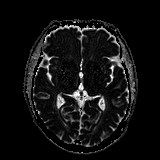
[im 44/44]
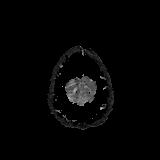

[Series 9: DWI · coronal · 5.0mm · 1.44mm/px · 4 of 60 slices shown (3 of 4)]
[im 1/60]
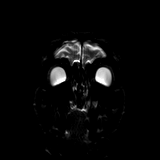
[im 20/60]
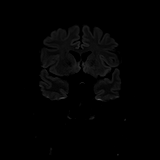
[im 40/60]
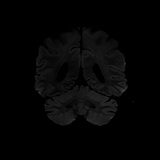
[im 60/60]
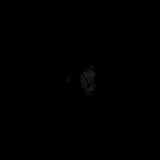

[Series 10: DWI · coronal · 5.0mm · 1.44mm/px · 2 of 29 slices shown (4 of 4)]
[im 1/29]
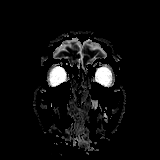
[im 29/29]
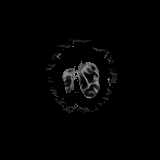

[Series 11: FLAIR · axial · 3.0mm · 0.72mm/px · z∈[-54,+93]mm · 2 of 26 slices shown]
[im 1/26]
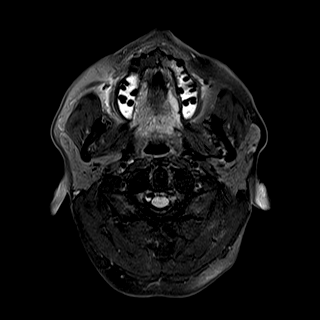
[im 26/26]
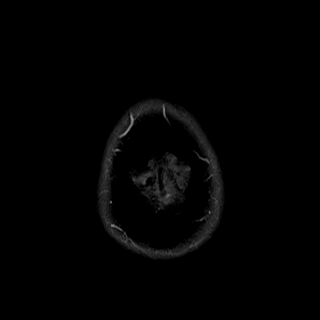

[Series 13: swi_images · axial · 3.0mm · 0.90mm/px · z∈[-53,+97]mm · 4 of 52 slices shown]
[im 1/52]
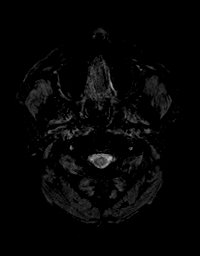
[im 18/52]
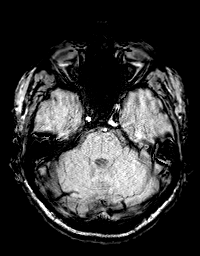
[im 35/52]
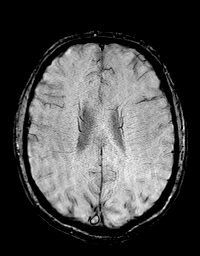
[im 52/52]
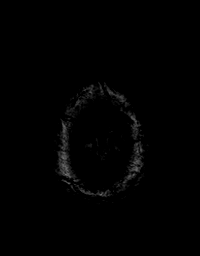

[Series 14: T1 · axial · 1.1mm · 0.90mm/px · z∈[-55,+99]mm · 10 of 144 slices shown (2 of 3)]
[im 1/144]
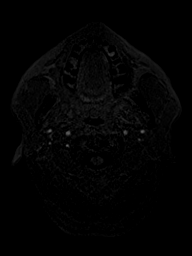
[im 16/144]
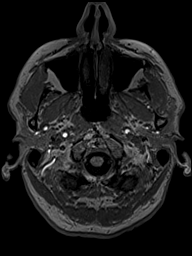
[im 32/144]
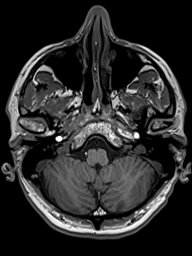
[im 48/144]
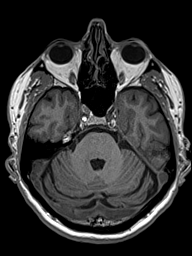
[im 64/144]
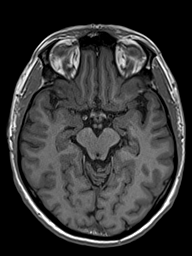
[im 80/144]
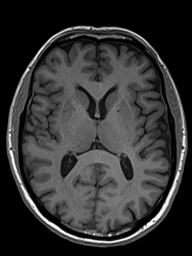
[im 96/144]
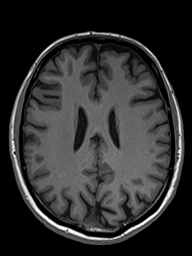
[im 112/144]
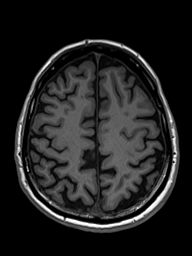
[im 128/144]
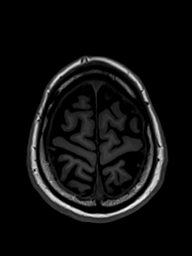
[im 144/144]
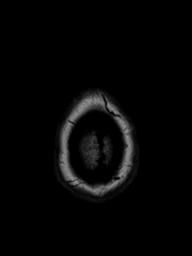

[Series 15: T2 post-contrast · coronal · 4.0mm · 0.36mm/px · 2 of 34 slices shown]
[im 1/34]
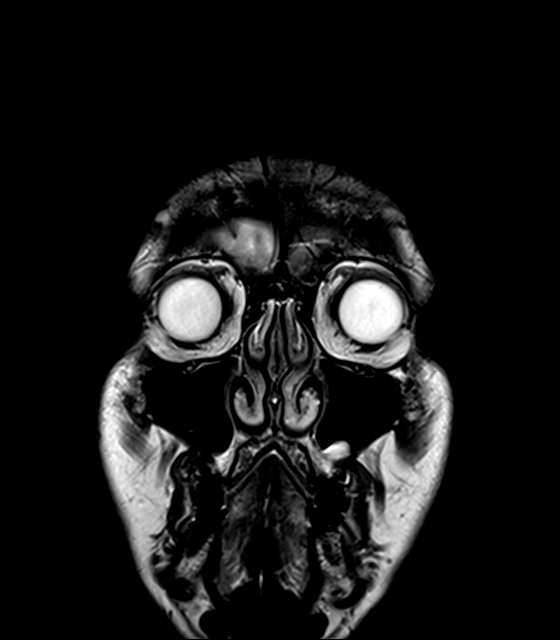
[im 34/34]
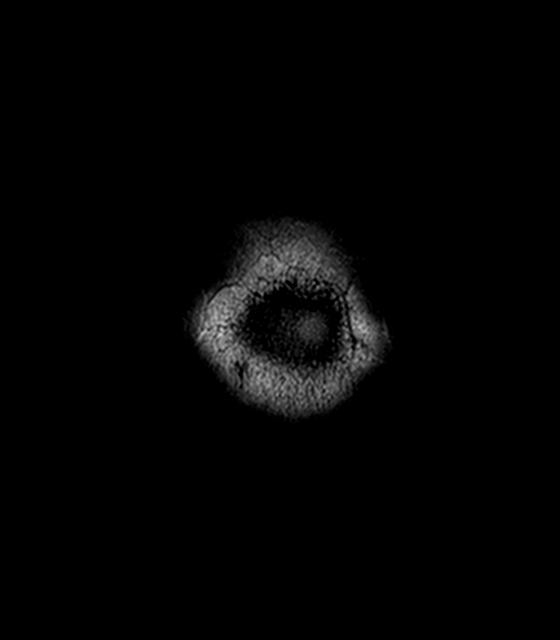

[Series 16: T1 · axial · 1.1mm · 0.90mm/px · z∈[-55,+99]mm · 10 of 144 slices shown (3 of 3)]
[im 1/144]
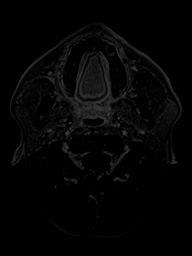
[im 16/144]
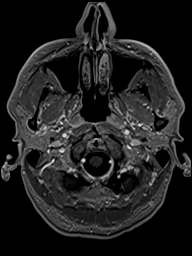
[im 32/144]
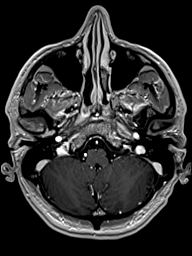
[im 48/144]
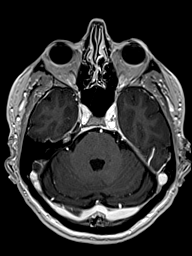
[im 64/144]
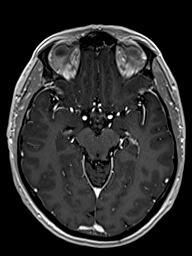
[im 80/144]
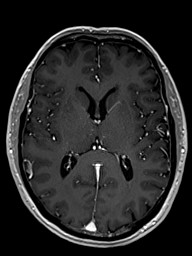
[im 96/144]
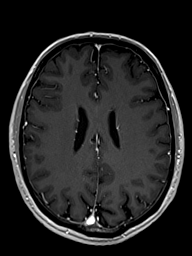
[im 112/144]
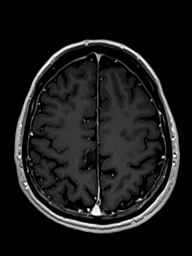
[im 128/144]
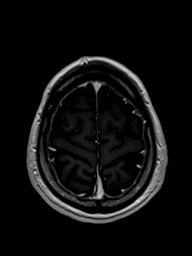
[im 144/144]
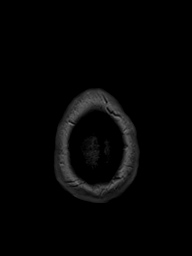

[Series 17: T1 post-contrast · coronal · 4.0mm · 0.72mm/px · 2 of 34 slices shown]
[im 1/34]
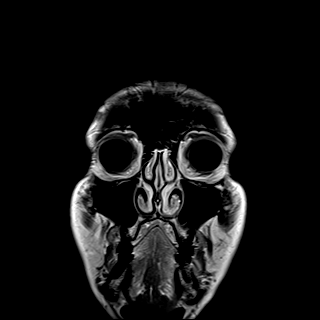
[im 34/34]
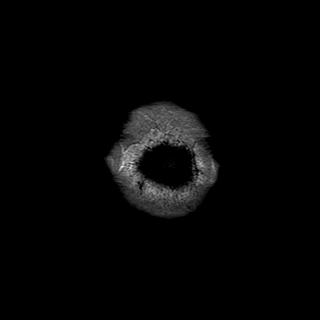

[48 of 48 positions shown; findings below may reference images not displayed]

FINDINGS: INTRACRANIAL CONTENTS: No reduced diffusion to suggest acute
ischemia, hyperacute demyelination or hypercellular tumor. No
susceptibility artifact to suggest hemorrhage. The ventricles and
sulci are normal for patient's age. No suspicious parenchymal
signal, masses, mass effect. A few scattered subcentimeter
supratentorial white matter FLAIR T2 hyperintensities, seen with
chronic small vessel ischemic disease, normal for age. No abnormal
intraparenchymal or extra-axial enhancement. No abnormal extra-axial
fluid collections. No extra-axial masses.

VASCULAR: Normal major intracranial vascular flow voids present at
skull base.

SKULL AND UPPER CERVICAL SPINE: No abnormal sellar expansion. No
suspicious calvarial bone marrow signal. Craniocervical junction
maintained.

SINUSES/ORBITS: Trace paranasal sinus mucosal thickening with small
LEFT maxillary mucosal retention cyst. The included ocular globes
and orbital contents are non-suspicious.

OTHER: None.
IMPRESSION: Normal MRI of the head with and without contrast for age.

## 2019-04-06 DIAGNOSIS — Z0000 Encounter for general adult medical examination without abnormal findings: Secondary | ICD-10-CM | POA: Diagnosis not present

## 2019-04-15 DIAGNOSIS — E7800 Pure hypercholesterolemia, unspecified: Secondary | ICD-10-CM | POA: Diagnosis not present

## 2019-04-15 DIAGNOSIS — Z125 Encounter for screening for malignant neoplasm of prostate: Secondary | ICD-10-CM | POA: Diagnosis not present

## 2019-04-19 DIAGNOSIS — L821 Other seborrheic keratosis: Secondary | ICD-10-CM | POA: Diagnosis not present

## 2019-04-19 DIAGNOSIS — L814 Other melanin hyperpigmentation: Secondary | ICD-10-CM | POA: Diagnosis not present

## 2019-04-19 DIAGNOSIS — Z85828 Personal history of other malignant neoplasm of skin: Secondary | ICD-10-CM | POA: Diagnosis not present

## 2019-04-19 DIAGNOSIS — D225 Melanocytic nevi of trunk: Secondary | ICD-10-CM | POA: Diagnosis not present

## 2019-04-25 ENCOUNTER — Encounter: Payer: Self-pay | Admitting: Gastroenterology

## 2019-05-05 ENCOUNTER — Encounter: Payer: Self-pay | Admitting: Gastroenterology

## 2019-05-06 ENCOUNTER — Encounter: Payer: Self-pay | Admitting: *Deleted

## 2019-05-06 ENCOUNTER — Other Ambulatory Visit: Payer: Self-pay

## 2019-05-06 ENCOUNTER — Ambulatory Visit (AMBULATORY_SURGERY_CENTER): Payer: Self-pay | Admitting: *Deleted

## 2019-05-06 VITALS — Ht 72.0 in | Wt 185.0 lb

## 2019-05-06 DIAGNOSIS — Z86010 Personal history of colonic polyps: Secondary | ICD-10-CM

## 2019-05-06 MED ORDER — PEG 3350-KCL-NA BICARB-NACL 420 G PO SOLR: 4000.0000 mL | Freq: Once | ORAL | 0 refills | Status: AC

## 2019-05-06 NOTE — Progress Notes (Signed)
No egg or soy allergy known to patient  No issues with past sedation with any surgeries  or procedures, no intubation problems  No diet pills per patient No home 02 use per patient  No blood thinners per patient  Pt denies issues with constipation  No A fib or A flutter  EMMI video sent to pt's e mail    Pt verified name, DOB, address and insurance during PV today. Pt mailed instruction packet to included paper to complete and mail back to [facility] with addressed and stamped envelope, Emmi video, copy of consent form to read and not return, and instructions. PV completed over the phone. Pt encouraged to call with questions or issues  Added to instructions to not use any chew or snuff Fri or procedure would be cancelled - pt also called and reminded of this and he returned verbal understanding    Pt is aware that care partner will wait in the car during parking lot; if they feel like they will be too hot to wait in the car; they may wait in the lobby.  We want them to wear a mask (we do not have any that we can provide them), practice social distancing, and we will check their temperatures when they get here.  I did remind patient that their care partner needs to stay in the parking lot the entire time. Pt will wear mask into building

## 2019-05-11 ENCOUNTER — Telehealth: Payer: Self-pay | Admitting: *Deleted

## 2019-05-11 NOTE — Telephone Encounter (Signed)
LM on VM for pt to call back for screening questions.

## 2019-05-11 NOTE — Telephone Encounter (Signed)
Covid-19 screening questions  Have you traveled in the last 14 days? no If yes where?  Do you now or have you had a fever in the last 14 days? no  Do you have any respiratory symptoms of shortness of breath or cough now or in the last 14 days? no  Do you have any family members or close contacts with diagnosed or suspected Covid-19 in the past 14 days? no  Have you been tested for Covid-19 and found to be positive? no       

## 2019-05-11 NOTE — Telephone Encounter (Signed)
Patient return your call °

## 2019-05-13 ENCOUNTER — Encounter: Payer: Self-pay | Admitting: Gastroenterology

## 2019-05-13 ENCOUNTER — Ambulatory Visit (AMBULATORY_SURGERY_CENTER): Payer: BC Managed Care – PPO | Admitting: Gastroenterology

## 2019-05-13 ENCOUNTER — Other Ambulatory Visit: Payer: Self-pay

## 2019-05-13 VITALS — BP 110/74 | HR 55 | Temp 98.4°F | Resp 12 | Ht 72.0 in | Wt 185.0 lb

## 2019-05-13 DIAGNOSIS — D125 Benign neoplasm of sigmoid colon: Secondary | ICD-10-CM

## 2019-05-13 DIAGNOSIS — Z86010 Personal history of colonic polyps: Secondary | ICD-10-CM

## 2019-05-13 DIAGNOSIS — D124 Benign neoplasm of descending colon: Secondary | ICD-10-CM

## 2019-05-13 DIAGNOSIS — Z1211 Encounter for screening for malignant neoplasm of colon: Secondary | ICD-10-CM | POA: Diagnosis not present

## 2019-05-13 DIAGNOSIS — D120 Benign neoplasm of cecum: Secondary | ICD-10-CM | POA: Diagnosis not present

## 2019-05-13 DIAGNOSIS — D123 Benign neoplasm of transverse colon: Secondary | ICD-10-CM

## 2019-05-13 DIAGNOSIS — K5730 Diverticulosis of large intestine without perforation or abscess without bleeding: Secondary | ICD-10-CM

## 2019-05-13 MED ORDER — SODIUM CHLORIDE 0.9 % IV SOLN: 500.0000 mL | Freq: Once | INTRAVENOUS | Status: DC

## 2019-05-13 NOTE — Progress Notes (Signed)
Pt's states no medical or surgical changes since previsit or office visit.  Temp [place]  Vital JB

## 2019-05-13 NOTE — Progress Notes (Signed)
Called to room to assist during endoscopic procedure.  Patient ID and intended procedure confirmed with present staff. Received instructions for my participation in the procedure from the performing physician.  

## 2019-05-13 NOTE — Patient Instructions (Signed)
YOU HAD AN ENDOSCOPIC PROCEDURE TODAY AT THE [facility]:   Refer to the procedure report that was given to you for any specific questions about what was found during the examination.  If the procedure report does not answer your questions, please call your gastroenterologist to clarify.  If you requested that your care partner not be given the details of your procedure findings, then the procedure report has been included in a sealed envelope for you to review at your convenience later.  YOU SHOULD EXPECT: Some feelings of bloating in the abdomen. Passage of more gas than usual.  Walking can help get rid of the air that was put into your GI tract during the procedure and reduce the bloating. If you had a lower endoscopy (such as a colonoscopy or flexible sigmoidoscopy) you may notice spotting of blood in your stool or on the toilet paper. If you underwent a bowel prep for your procedure, you may not have a normal bowel movement for a few days.  Please Note:  You might notice some irritation and congestion in your nose or some drainage.  This is from the oxygen used during your procedure.  There is no need for concern and it should clear up in a day or so.  SYMPTOMS TO REPORT IMMEDIATELY:   Following lower endoscopy (colonoscopy or flexible sigmoidoscopy):  Excessive amounts of blood in the stool  Significant tenderness or worsening of abdominal pains  Swelling of the abdomen that is new, acute  Fever of 100F or higher  For urgent or emergent issues, a gastroenterologist can be reached at any hour by calling [phone].   DIET:  We do recommend a small meal at first, but then you may proceed to your regular diet.  Drink plenty of fluids but you should avoid alcoholic beverages for 24 hours.  ACTIVITY:  You should plan to take it easy for the rest of today and you should NOT DRIVE or use heavy machinery until tomorrow (because of the sedation medicines used during the test).     FOLLOW UP: Our staff will call the number listed on your records 48-72 hours following your procedure to check on you and address any questions or concerns that you may have regarding the information given to you following your procedure. If we do not reach you, we will leave a message.  We will attempt to reach you two times.  During this call, we will ask if you have developed any symptoms of COVID 19. If you develop any symptoms (ie: fever, flu-like symptoms, shortness of breath, cough etc.) before then, please call [phone].  If you test positive for Covid 19 in the 2 weeks post procedure, please call and report this information to us.    If any biopsies were taken you will be contacted by phone or by letter within the next 1-3 weeks.  Please call us at [phone] if you have not heard about the biopsies in 3 weeks.    SIGNATURES/CONFIDENTIALITY: You and/or your care partner have signed paperwork which will be entered into your electronic medical record.  These signatures attest to the fact that that the information above on your After Visit Summary has been reviewed and is understood.  Full responsibility of the confidentiality of this discharge information lies with you and/or your care-partner. 

## 2019-05-13 NOTE — Op Note (Signed)
[facility] Patient Name: Brandon [name] Procedure Date: 05/13/2019 3:26 PM MRN: [id number] Endoscopist: Milus Banister , MD Age: 68 Referring MD:  Date of Birth: [birth year] Gender: Male Account #: [id number] Procedure:                Colonoscopy Indications:              High risk colon cancer surveillance: Personal                            history of colonic polyps; (Colonoscopy 2017 4                            SSAs, Colonoscopy 2014 7 mixed SSA, colonoscopy 201                            and 9 SSAs 2011 Medicines:                Monitored Anesthesia Care Procedure:                Pre-Anesthesia Assessment:                           - Prior to the procedure, a History and Physical                            was performed, and patient medications and                            allergies were reviewed. The patient's tolerance of                            previous anesthesia was also reviewed. The risks                            and benefits of the procedure and the sedation                            options and risks were discussed with the patient.                            All questions were answered, and informed consent                            was obtained. Prior Anticoagulants: The patient has                            taken no previous anticoagulant or antiplatelet                            agents. ASA Grade Assessment: II - A patient with                            mild systemic disease. After reviewing the risks  and benefits, the patient was deemed in                            satisfactory condition to undergo the procedure.                           After obtaining informed consent, the colonoscope                            was passed under direct vision. Throughout the                            procedure, the patient's blood pressure, pulse, and                            oxygen saturations were monitored continuously. The                            Colonoscope was introduced through the anus and                            advanced to the the cecum, identified by                            appendiceal orifice and ileocecal valve. The                            colonoscopy was performed without difficulty. The                            patient tolerated the procedure well. The quality                            of the bowel preparation was good. The ileocecal                            valve, appendiceal orifice, and rectum were                            photographed. Scope In: 3:29:55 PM Scope Out: 3:41:24 PM Scope Withdrawal Time: 0 hours 8 minutes 52 seconds  Total Procedure Duration: 0 hours 11 minutes 29 seconds  Findings:                 Five sessile polyps were found in the sigmoid                            colon, descending colon, transverse colon and                            cecum. The polyps were 2 to 4 mm in size. These                            polyps were removed with a cold snare. Resection  and retrieval were complete.                           Multiple small and large-mouthed diverticula were                            found in the entire colon.                           The exam was otherwise without abnormality on                            direct and retroflexion views. Complications:            No immediate complications. Estimated blood loss:                            None. Estimated Blood Loss:     Estimated blood loss: none. Impression:               - Five 2 to 4 mm polyps in the sigmoid colon, in                            the descending colon, in the transverse colon and                            in the cecum, removed with a cold snare. Resected                            and retrieved.                           - Diverticulosis in the entire examined colon.                           - The examination was otherwise normal on direct                             and retroflexion views. Recommendation:           - Patient has a contact number available for                            emergencies. The signs and symptoms of potential                            delayed complications were discussed with the                            patient. Return to normal activities tomorrow.                            Written discharge instructions were provided to the                            patient.                           -  Resume previous diet.                           - Continue present medications.                           You will receive a letter within 2-3 weeks with the                            pathology results and my final recommendations.                           If the polyp(s) is proven to be 'pre-cancerous' on                            pathology, you will need repeat colonoscopy in 3-5                            years. Milus Banister, MD 05/13/2019 3:46:17 PM This report has been signed electronically.

## 2019-05-13 NOTE — Progress Notes (Signed)
Report to PACU, RN, vss, BBS= Clear.  

## 2019-05-17 ENCOUNTER — Telehealth: Payer: Self-pay

## 2019-05-17 NOTE — Telephone Encounter (Signed)
1. Have you developed a fever since your procedure? No.  2.   Have you had an respiratory symptoms (SOB or cough) since your procedure? No.  3.   Have you tested positive for COVID 19 since your procedure No.  4.   Have you had any family members/close contacts diagnosed with the COVID 19 since your procedure?  No.   If yes to any of these questions please route to Joylene John, RN and Alphonsa Gin, RN.  Follow up Call-  Call back number 05/13/2019  Post procedure Call Back phone  # [phone]  Permission to leave phone message Yes  Some recent data might be hidden     Patient questions:  Do you have a fever, pain , or abdominal swelling? No. Pain Score  0 *  Have you tolerated food without any problems? Yes.    Have you been able to return to your normal activities? Yes.    Do you have any questions about your discharge instructions: Diet   No. Medications  No. Follow up visit  No.  Do you have questions or concerns about your Care? No.  Actions: * If pain score is 4 or above: No action needed, pain <4.

## 2019-05-18 ENCOUNTER — Encounter: Payer: Self-pay | Admitting: Gastroenterology

## 2019-10-07 DIAGNOSIS — Z03818 Encounter for observation for suspected exposure to other biological agents ruled out: Secondary | ICD-10-CM | POA: Diagnosis not present

## 2019-10-07 DIAGNOSIS — Z7189 Other specified counseling: Secondary | ICD-10-CM | POA: Diagnosis not present

## 2019-10-07 DIAGNOSIS — Z20828 Contact with and (suspected) exposure to other viral communicable diseases: Secondary | ICD-10-CM | POA: Diagnosis not present

## 2019-11-15 DIAGNOSIS — Z20828 Contact with and (suspected) exposure to other viral communicable diseases: Secondary | ICD-10-CM | POA: Diagnosis not present

## 2019-12-14 DIAGNOSIS — Z20828 Contact with and (suspected) exposure to other viral communicable diseases: Secondary | ICD-10-CM | POA: Diagnosis not present

## 2020-01-02 DIAGNOSIS — I480 Paroxysmal atrial fibrillation: Secondary | ICD-10-CM | POA: Diagnosis not present

## 2020-02-20 DIAGNOSIS — Z03818 Encounter for observation for suspected exposure to other biological agents ruled out: Secondary | ICD-10-CM | POA: Diagnosis not present

## 2020-02-20 DIAGNOSIS — Z20828 Contact with and (suspected) exposure to other viral communicable diseases: Secondary | ICD-10-CM | POA: Diagnosis not present

## 2020-02-22 DIAGNOSIS — Z20828 Contact with and (suspected) exposure to other viral communicable diseases: Secondary | ICD-10-CM | POA: Diagnosis not present

## 2020-04-06 DIAGNOSIS — E7800 Pure hypercholesterolemia, unspecified: Secondary | ICD-10-CM | POA: Diagnosis not present

## 2020-04-06 DIAGNOSIS — Z0000 Encounter for general adult medical examination without abnormal findings: Secondary | ICD-10-CM | POA: Diagnosis not present

## 2020-04-06 DIAGNOSIS — Z125 Encounter for screening for malignant neoplasm of prostate: Secondary | ICD-10-CM | POA: Diagnosis not present

## 2020-04-06 DIAGNOSIS — Z23 Encounter for immunization: Secondary | ICD-10-CM | POA: Diagnosis not present

## 2020-04-19 DIAGNOSIS — L821 Other seborrheic keratosis: Secondary | ICD-10-CM | POA: Diagnosis not present

## 2020-04-19 DIAGNOSIS — D485 Neoplasm of uncertain behavior of skin: Secondary | ICD-10-CM | POA: Diagnosis not present

## 2020-04-19 DIAGNOSIS — L578 Other skin changes due to chronic exposure to nonionizing radiation: Secondary | ICD-10-CM | POA: Diagnosis not present

## 2020-04-19 DIAGNOSIS — D225 Melanocytic nevi of trunk: Secondary | ICD-10-CM | POA: Diagnosis not present

## 2020-05-04 DIAGNOSIS — D225 Melanocytic nevi of trunk: Secondary | ICD-10-CM | POA: Diagnosis not present

## 2020-05-04 DIAGNOSIS — D485 Neoplasm of uncertain behavior of skin: Secondary | ICD-10-CM | POA: Diagnosis not present

## 2020-08-10 DIAGNOSIS — Z03818 Encounter for observation for suspected exposure to other biological agents ruled out: Secondary | ICD-10-CM | POA: Diagnosis not present

## 2020-08-10 DIAGNOSIS — Z20822 Contact with and (suspected) exposure to covid-19: Secondary | ICD-10-CM | POA: Diagnosis not present

## 2020-09-23 DIAGNOSIS — Z20822 Contact with and (suspected) exposure to covid-19: Secondary | ICD-10-CM | POA: Diagnosis not present

## 2020-11-05 DIAGNOSIS — L578 Other skin changes due to chronic exposure to nonionizing radiation: Secondary | ICD-10-CM | POA: Diagnosis not present

## 2020-11-05 DIAGNOSIS — D2272 Melanocytic nevi of left lower limb, including hip: Secondary | ICD-10-CM | POA: Diagnosis not present

## 2020-11-05 DIAGNOSIS — D485 Neoplasm of uncertain behavior of skin: Secondary | ICD-10-CM | POA: Diagnosis not present

## 2020-11-05 DIAGNOSIS — L821 Other seborrheic keratosis: Secondary | ICD-10-CM | POA: Diagnosis not present

## 2020-11-05 DIAGNOSIS — D225 Melanocytic nevi of trunk: Secondary | ICD-10-CM | POA: Diagnosis not present

## 2020-11-05 DIAGNOSIS — L570 Actinic keratosis: Secondary | ICD-10-CM | POA: Diagnosis not present

## 2020-11-07 DIAGNOSIS — Z20822 Contact with and (suspected) exposure to covid-19: Secondary | ICD-10-CM | POA: Diagnosis not present

## 2020-12-07 DIAGNOSIS — Z20822 Contact with and (suspected) exposure to covid-19: Secondary | ICD-10-CM | POA: Diagnosis not present

## 2021-09-08 ENCOUNTER — Other Ambulatory Visit: Payer: Self-pay

## 2021-09-08 ENCOUNTER — Emergency Department (HOSPITAL_COMMUNITY)
Admission: EM | Admit: 2021-09-08 | Discharge: 2021-09-08 | Disposition: A | Discharge status: Home / Self Care | Payer: Medicare Other | Attending: Emergency Medicine | Admitting: Emergency Medicine

## 2021-09-08 ENCOUNTER — Emergency Department (HOSPITAL_COMMUNITY): Payer: Medicare Other

## 2021-09-08 DIAGNOSIS — I4891 Unspecified atrial fibrillation: Secondary | ICD-10-CM

## 2021-09-08 DIAGNOSIS — I480 Paroxysmal atrial fibrillation: Secondary | ICD-10-CM | POA: Diagnosis not present

## 2021-09-08 DIAGNOSIS — Z87891 Personal history of nicotine dependence: Secondary | ICD-10-CM | POA: Diagnosis not present

## 2021-09-08 DIAGNOSIS — R000 Tachycardia, unspecified: Secondary | ICD-10-CM | POA: Diagnosis present

## 2021-09-08 LAB — CBC WITH DIFFERENTIAL/PLATELET
Abs Immature Granulocytes: 0.02 10*3/uL (ref 0.00–0.07)
Basophils Absolute: 0 10*3/uL (ref 0.0–0.1)
Basophils Relative: 0 %
Eosinophils Absolute: 0.1 10*3/uL (ref 0.0–0.5)
Eosinophils Relative: 1 %
HCT: 48.7 % (ref 39.0–52.0)
Hemoglobin: 16.4 g/dL (ref 13.0–17.0)
Immature Granulocytes: 0 %
Lymphocytes Relative: 30 %
Lymphs Abs: 2.3 10*3/uL (ref 0.7–4.0)
MCH: 30.3 pg (ref 26.0–34.0)
MCHC: 33.7 g/dL (ref 30.0–36.0)
MCV: 90 fL (ref 80.0–100.0)
Monocytes Absolute: 0.6 10*3/uL (ref 0.1–1.0)
Monocytes Relative: 8 %
Neutro Abs: 4.5 10*3/uL (ref 1.7–7.7)
Neutrophils Relative %: 61 %
Platelets: 200 10*3/uL (ref 150–400)
RBC: 5.41 MIL/uL (ref 4.22–5.81)
RDW: 13.1 % (ref 11.5–15.5)
WBC: 7.5 10*3/uL (ref 4.0–10.5)
nRBC: 0 % (ref 0.0–0.2)

## 2021-09-08 LAB — COMPREHENSIVE METABOLIC PANEL
ALT: 33 U/L (ref 0–44)
AST: 38 U/L (ref 15–41)
Albumin: 3.8 g/dL (ref 3.5–5.0)
Alkaline Phosphatase: 103 U/L (ref 38–126)
Anion gap: 10 (ref 5–15)
BUN: 14 mg/dL (ref 8–23)
CO2: 22 mmol/L (ref 22–32)
Calcium: 9.3 mg/dL (ref 8.9–10.3)
Chloride: 104 mmol/L (ref 98–111)
Creatinine, Ser: 1.04 mg/dL (ref 0.61–1.24)
GFR, Estimated: >60 mL/min (ref >60)
Glucose, Bld: 153 mg/dL — ABNORMAL HIGH (ref 70–99)
Potassium: 4.1 mmol/L (ref 3.5–5.1)
Sodium: 136 mmol/L (ref 135–145)
Total Bilirubin: 0.9 mg/dL (ref 0.3–1.2)
Total Protein: 7.1 g/dL (ref 6.5–8.1)

## 2021-09-08 LAB — MAGNESIUM: Magnesium: 2.1 mg/dL (ref 1.7–2.4)

## 2021-09-08 NOTE — ED Provider Notes (Signed)
Emergency Medicine Provider Triage Evaluation Note  Brandon [name] , a 70 y.o. male  was evaluated in triage.  Pt complains of feeling like he is in A. fib.  He felt like this started this afternoon.  He has medication that he takes as needed, he felt like originally his heart rate was in the 140s, he took 1 dose of his medications which improved his heart rate to the 130s, on the second which brought it down to the 110s to 20s.  He states that he does not have a cardiologist, he takes a baby aspirin.   Review of Systems  Positive: Chest tightness Negative: Leg swelling  Physical Exam  BP (!) 133/101   Pulse 90   Temp 98.1 F (36.7 C) (Oral)   Resp 16   Ht 6' (1.829 m)   Wt 90.7 kg   SpO2 99%   BMI 27.12 kg/m  Gen:   Awake, no distress   Resp:  Normal effort  MSK:   Moves extremities without difficulty  Other:  Irregular rhythm, 2+ radial pulses.  Lungs clear to auscultation bilaterally.  Mildly diaphoretic.  Medical Decision Making  Medically screening exam initiated at 9:30 PM.  Appropriate orders placed.  Brandon [name] was informed that the remainder of the evaluation will be completed by another provider, this initial triage assessment does not replace that evaluation, and the importance of remaining in the ED until their evaluation is complete.  Note: Portions of this report may have been transcribed using voice recognition software. Every effort was made to ensure accuracy; however, inadvertent computerized transcription errors may be present    Brandon [name] 09/08/21 2131    Brandon Merino, MD 09/09/21 [phone]

## 2021-09-08 NOTE — Discharge Instructions (Addendum)
Follow up with cardiology as discussed. Return to the ED with any new or concerning symptoms at any time.

## 2021-09-08 NOTE — ED Triage Notes (Signed)
Pt c/o atrial fibrillation. Pt reports he has a history of same. Pt reports it feels like his heart is racing.

## 2021-09-08 NOTE — ED Provider Notes (Signed)
[facility] EMERGENCY DEPARTMENT Provider Note   CSN: [id number] Arrival date & time: 09/08/21  2051     History Chief Complaint  Patient presents with   Atrial Fibrillation    Brandon [name] is a 70 y.o. male.  Patient to ED for evaluation of atrial fibrillation. His first episode was 3-4 years ago, when he spontaneously converted and was given Metoprolol 25 mg for prn use. He states he had no further episodes until 2 years later, but they have been becoming more frequent, now as often as every 1-2 weeks. He reports heart rates as high as the 140's. Tonight symptoms started in the late afternoon. He took one 25 mg Metoprolol around 6:00 pm with some improvement but not resolution. He took a second tablet around 7:00 pm and proceeded to the ED. He denies chest pain but feels uncomfortable because of the high rate and feeling anxious. No nausea, vomiting. No SOB.   The history is provided by the patient. No language interpreter was used.  Atrial Fibrillation Pertinent negatives include no shortness of breath.      Past Medical History:  Diagnosis Date   Allergy    pollen this yr 2020    Diverticulosis    Hx of adenomatous colonic polyps    Hx of atrial fibrillation, no current medication    2018 , one night in hospital , corrected and no further issues   Hyperlipidemia    Irregular heart beats    "for a while"    Patient Active Problem List   Diagnosis Date Noted   Atrial fibrillation with rapid ventricular response ([place]) 05/11/2016   Atrial fibrillation ([place]) 05/11/2016   Hamstring strain 01/26/2013   Achilles tendinitis 12/15/2012   PALPITATIONS 03/28/2010   HYPERLIPIDEMIA 03/27/2010   SMOKER 03/27/2010   PREMATURE VENTRICULAR CONTRACTIONS 03/27/2010   CHEST PAIN 03/27/2010    Past Surgical History:  Procedure Laterality Date   APPENDECTOMY  1968   COLONOSCOPY     EXTERNAL EAR SURGERY  1997, 2000   HEMORRHOID SURGERY  1987   POLYPECTOMY          Family History  Problem Relation Age of Onset   Diabetes Father    Heart attack Brother    Sudden death Brother    Hyperlipidemia Neg Hx    Hypertension Neg Hx    Colon cancer Neg Hx    Colon polyps Neg Hx    Esophageal cancer Neg Hx    Rectal cancer Neg Hx    Stomach cancer Neg Hx     Social History   Tobacco Use   Smoking status: Former    Packs/day: 0.25    Types: Cigarettes    Quit date: 05/01/2018    Years since quitting: 3.3   Smokeless tobacco: Current    Types: Snuff   Tobacco comments:    smokes 3 cigarettes a day  Substance Use Topics   Alcohol use: Yes    Alcohol/week: 14.0 standard drinks    Types: 14 Glasses of wine per week    Comment: or less    Drug use: No    Home Medications Prior to Admission medications   Medication Sig Start Date End Date Taking? Authorizing Provider  atorvastatin (LIPITOR) 40 MG tablet Take 40 mg by mouth daily.    [provider]  Multiple Vitamin (THERA) TABS Take by mouth.    [provider]  OMEGA 3 1000 MG CAPS Take 2,000 mg by mouth daily.  [provider]  sildenafil (REVATIO) 20 MG tablet Take by mouth.    [provider]    Allergies    Orange concentrate [flavoring agent]  Review of Systems   Review of Systems  Constitutional:  Negative for chills and fever.  HENT: Negative.    Respiratory: Negative.  Negative for shortness of breath.   Cardiovascular:  Positive for palpitations.  Gastrointestinal: Negative.   Musculoskeletal: Negative.   Skin: Negative.   Neurological: Negative.    Physical Exam Updated Vital Signs BP (!) 152/126   Pulse (!) 115   Temp 98.1 F (36.7 C) (Oral)   Resp 11   Ht 6' (1.829 m)   Wt 90.7 kg   SpO2 100%   BMI 27.12 kg/m   Physical Exam Vitals and nursing note reviewed.  Constitutional:      General: He is not in acute distress.    Appearance: He is well-developed. He is not ill-appearing.  HENT:     Head: Normocephalic.   Cardiovascular:     Rate and Rhythm: Normal rate and regular rhythm.     Heart sounds: No murmur heard. Pulmonary:     Effort: Pulmonary effort is normal.     Breath sounds: Normal breath sounds. No wheezing, rhonchi or rales.  Abdominal:     General: Bowel sounds are normal.     Palpations: Abdomen is soft.     Tenderness: There is no abdominal tenderness. There is no guarding or rebound.  Musculoskeletal:        General: Normal range of motion.     Cervical back: Normal range of motion and neck supple.     Right lower leg: No edema.     Left lower leg: No edema.  Skin:    General: Skin is warm and dry.  Neurological:     General: No focal deficit present.     Mental Status: He is alert and oriented to person, place, and time.    ED Results / Procedures / Treatments   Labs (all labs ordered are listed, but only abnormal results are displayed) Labs Reviewed  CBC WITH DIFFERENTIAL/PLATELET  COMPREHENSIVE METABOLIC PANEL  MAGNESIUM    EKG None  Radiology No results found.  Procedures Procedures   Medications Ordered in ED Medications - No data to display  ED Course  I have reviewed the triage vital signs and the nursing notes.  Pertinent labs & imaging results that were available during my care of the patient were reviewed by me and considered in my medical decision making (see chart for details).    MDM Rules/Calculators/A&P                           Patient to ED in recurrent atrial fibrillation, confirmed by EKG on arrival with rate of 126. He has since converted to NSR on my exam, rate in the 80's and completely asymptomatic.   Labs reviewed and are unremarkable. The patient has remained in NSR under observation for the past 1.5 hours. He is felt appropriate for discharge home.   He reports he is not followed by cardiology. Will provide ambulatory referral upon discharge.  Final Clinical Impression(s) / ED Diagnoses Final diagnoses:  None   PAF  Rx  / DC Orders ED Discharge Orders     None        Dennie Bible 09/08/21 2335    Valarie Merino, MD 09/09/21 [phone]

## 2021-09-09 ENCOUNTER — Telehealth (HOSPITAL_COMMUNITY): Payer: Self-pay

## 2021-09-09 NOTE — Telephone Encounter (Signed)
Reached out to patient to offer ED f/u. No answer left vm with telephone number to return call.

## 2021-09-13 ENCOUNTER — Encounter (HOSPITAL_COMMUNITY): Payer: Self-pay | Admitting: Physician Assistant

## 2021-09-13 ENCOUNTER — Ambulatory Visit (HOSPITAL_COMMUNITY)
Admission: RE | Admit: 2021-09-13 | Discharge: 2021-09-13 | Disposition: A | Discharge status: Home / Self Care | Payer: Medicare Other | Source: Ambulatory Visit | Attending: Physician Assistant | Admitting: Physician Assistant

## 2021-09-13 ENCOUNTER — Other Ambulatory Visit: Payer: Self-pay

## 2021-09-13 VITALS — BP 138/82 | HR 70 | Ht 72.0 in | Wt 200.2 lb

## 2021-09-13 DIAGNOSIS — Z87891 Personal history of nicotine dependence: Secondary | ICD-10-CM | POA: Insufficient documentation

## 2021-09-13 DIAGNOSIS — Z7982 Long term (current) use of aspirin: Secondary | ICD-10-CM | POA: Insufficient documentation

## 2021-09-13 DIAGNOSIS — I480 Paroxysmal atrial fibrillation: Secondary | ICD-10-CM | POA: Diagnosis present

## 2021-09-13 DIAGNOSIS — Z79899 Other long term (current) drug therapy: Secondary | ICD-10-CM | POA: Insufficient documentation

## 2021-09-13 DIAGNOSIS — Z7901 Long term (current) use of anticoagulants: Secondary | ICD-10-CM | POA: Insufficient documentation

## 2021-09-13 DIAGNOSIS — Z8249 Family history of ischemic heart disease and other diseases of the circulatory system: Secondary | ICD-10-CM | POA: Diagnosis not present

## 2021-09-13 DIAGNOSIS — E785 Hyperlipidemia, unspecified: Secondary | ICD-10-CM | POA: Diagnosis not present

## 2021-09-13 MED ORDER — METOPROLOL TARTRATE 25 MG PO TABS: 25.0000 mg | ORAL_TABLET | Freq: Two times a day (BID) | ORAL | 3 refills | Status: DC

## 2021-09-13 NOTE — Patient Instructions (Signed)
Start metoprolol 25mg twice a day 

## 2021-09-13 NOTE — Progress Notes (Signed)
Primary Care Physician: Alroy Dust, L.Marlou Sa, MD Primary Cardiologist: Dr Percival Spanish (remotely) Primary Electrophysiologist: none Referring Physician: [name]   Brandon [name] is a 70 y.o. male with a history of HLD and atrial fibrillation who presents for consultation in the [facility].  The patient was initially diagnosed with atrial fibrillation 05/2016 when he was admitted for afib with RVR, likely related to alcohol use. He converted with IV diltiazem and was discharged with PRN BB. Patient has a CHADS2VASC score of 1. He has done well for years but more recently has had more frequent episodes of afib. He was seen at the ED in [place] and underwent DCCV. He presented to the ED 09/08/21 after taking two doses of PRN metoprolol without resolution of his tachypalpitations. He converted spontaneously while being evaluated at the ED. He feels well today but states he has an episode of afib ~ every 2-3 weeks with tachypalpitations. There does not appear to be any specific triggers.   Today, he denies symptoms of chest pain, shortness of breath, orthopnea, PND, lower extremity edema, dizziness, presyncope, syncope, snoring, daytime somnolence, bleeding, or neurologic sequela. The patient is tolerating medications without difficulties and is otherwise without complaint today.    Atrial Fibrillation Risk Factors:  he does not have symptoms or diagnosis of sleep apnea. he does not have a history of rheumatic fever.   he has a BMI of Body mass index is 27.15 kg/m.Marland Kitchen Filed Weights   09/13/21 0927  Weight: 90.8 kg    Family History  Problem Relation Age of Onset   Diabetes Father    Heart attack Brother    Sudden death Brother    Hyperlipidemia Neg Hx    Hypertension Neg Hx    Colon cancer Neg Hx    Colon polyps Neg Hx    Esophageal cancer Neg Hx    Rectal cancer Neg Hx    Stomach cancer Neg Hx      Atrial Fibrillation Management  history:  Previous antiarrhythmic drugs: none Previous cardioversions: 03/2021 Previous ablations: none CHADS2VASC score: 1 Anticoagulation history: Eliquis   Past Medical History:  Diagnosis Date   Allergy    pollen this yr 2020    Diverticulosis    Hx of adenomatous colonic polyps    Hx of atrial fibrillation, no current medication    2018 , one night in hospital , corrected and no further issues   Hyperlipidemia    Irregular heart beats    "for a while"   Past Surgical History:  Procedure Laterality Date   APPENDECTOMY  1968   COLONOSCOPY     EXTERNAL EAR [place], 2000   HEMORRHOID SURGERY  1987   POLYPECTOMY      Current Outpatient Medications  Medication Sig Dispense Refill   aspirin EC 81 MG tablet Take 81 mg by mouth daily. Swallow whole.     atorvastatin (LIPITOR) 40 MG tablet Take 40 mg by mouth daily.     Multiple Vitamin (THERA) TABS Take by mouth.     OMEGA 3 1000 MG CAPS Take 2,000 mg by mouth daily.      Red Yeast Rice 600 MG TABS Take 2 tablets by mouth daily.     sildenafil (REVATIO) 20 MG tablet Take by mouth.     Tart Cherry 1200 MG CAPS Take 2 tablets by mouth daily.     metoprolol tartrate (LOPRESSOR) 25 MG tablet Take 1 tablet (25 mg total) by  mouth 2 (two) times daily. 60 tablet 3   No current facility-administered medications for this encounter.    Allergies  Allergen Reactions   Orange Concentrate [Flavoring Agent] Shortness Of Breath and Swelling    Orange flavor dye    Social History   Socioeconomic History   Marital status: Single    Spouse name: Not on file   Number of children: Not on file   Years of education: Not on file   Highest education level: Not on file  Occupational History   Not on file  Tobacco Use   Smoking status: Former    Packs/day: 0.25    Types: Cigarettes    Quit date: 05/01/2018    Years since quitting: 3.3   Smokeless tobacco: Current    Types: Snuff   Tobacco comments:    smokes 3 cigarettes a  day  Substance and Sexual Activity   Alcohol use: Yes    Alcohol/week: 14.0 standard drinks    Types: 14 Glasses of wine per week    Comment: or less    Drug use: No   Sexual activity: Not on file  Other Topics Concern   Not on file  Social History Narrative   Not on file   Social Determinants of Health   Financial Resource Strain: Not on file  Food Insecurity: Not on file  Transportation Needs: Not on file  Physical Activity: Not on file  Stress: Not on file  Social Connections: Not on file  Intimate Partner Violence: Not on file     ROS- All systems are reviewed and negative except as per the HPI above.  Physical Exam: Vitals:   09/13/21 0927  BP: 138/82  Pulse: 70  Weight: 90.8 kg  Height: 6' (1.829 m)    GEN- The patient is a well appearing male, alert and oriented x 3 today.   Head- normocephalic, atraumatic Eyes-  Sclera clear, conjunctiva pink Ears- hearing intact Oropharynx- clear Neck- supple  Lungs- Clear to ausculation bilaterally, normal work of breathing Heart- Regular rate and rhythm, no murmurs, rubs or gallops  GI- soft, NT, ND, + BS Extremities- no clubbing, cyanosis, or edema MS- no significant deformity or atrophy Skin- no rash or lesion Psych- euthymic mood, full affect Neuro- strength and sensation are intact  Wt Readings from Last 3 Encounters:  09/13/21 90.8 kg  09/08/21 90.7 kg  05/13/19 83.9 kg    EKG today demonstrates  SR Vent. rate 70 BPM PR interval 152 ms QRS duration 102 ms QT/QTcB 394/425 ms  Echo 05/12/16 demonstrated  - Left ventricle: The cavity size was normal. Systolic function was    normal. The estimated ejection fraction was in the range of 60%    to 65%. Wall motion was normal; there were no regional wall    motion abnormalities. Left ventricular diastolic function    parameters were normal.  - Aorta: Aortic root dimension: 38 mm (ED).  - Ascending aorta: The ascending aorta was mildly dilated.  - Mitral  valve: There was trivial regurgitation.  - Right atrium: The atrium was mildly dilated.   Epic records are reviewed at length today  CHA2DS2-VASc Score = 1  The patient's score is based upon: CHF History: 0 HTN History: 0 Diabetes History: 0 Stroke History: 0 Vascular Disease History: 0 Age Score: 1 Gender Score: 0      ASSESSMENT AND PLAN: 1. Paroxysmal Atrial Fibrillation (ICD10:  I48.0) The patient's CHA2DS2-VASc score is 1, indicating a 0.6% annual risk  of stroke.   General education about afib provided and questions answered. We also discussed his stroke risk and the risks and benefits of anticoagulation. We discussed options for rhythm control including AAD (Multaq, flecainide) vs ablation. Patient is interested in consultation with EP for ablation.  Will check echocardiogram.  Will start Lopressor 25 mg BID Not currently on anticoagulation.     Follow up with EP for ablation evaluation.    [facility] [address] [place], [place] [address] [phone] 09/13/2021 10:16 AM

## 2021-09-16 ENCOUNTER — Other Ambulatory Visit: Payer: Self-pay

## 2021-09-16 ENCOUNTER — Ambulatory Visit (HOSPITAL_COMMUNITY)
Admission: RE | Admit: 2021-09-16 | Discharge: 2021-09-16 | Disposition: A | Discharge status: Home / Self Care | Payer: Medicare Other | Source: Ambulatory Visit | Attending: Physician Assistant | Admitting: Physician Assistant

## 2021-09-16 DIAGNOSIS — I480 Paroxysmal atrial fibrillation: Secondary | ICD-10-CM | POA: Insufficient documentation

## 2021-09-16 DIAGNOSIS — F17210 Nicotine dependence, cigarettes, uncomplicated: Secondary | ICD-10-CM | POA: Diagnosis not present

## 2021-09-16 DIAGNOSIS — I499 Cardiac arrhythmia, unspecified: Secondary | ICD-10-CM | POA: Diagnosis not present

## 2021-09-16 DIAGNOSIS — E785 Hyperlipidemia, unspecified: Secondary | ICD-10-CM | POA: Diagnosis not present

## 2021-09-16 DIAGNOSIS — R9431 Abnormal electrocardiogram [ECG] [EKG]: Secondary | ICD-10-CM | POA: Insufficient documentation

## 2021-09-16 DIAGNOSIS — I081 Rheumatic disorders of both mitral and tricuspid valves: Secondary | ICD-10-CM | POA: Insufficient documentation

## 2021-09-16 DIAGNOSIS — R079 Chest pain, unspecified: Secondary | ICD-10-CM | POA: Diagnosis not present

## 2021-09-16 LAB — ECHOCARDIOGRAM COMPLETE
Area-P 1/2: 3.48 cm2
Calc EF: 55.7 %
S' Lateral: 3 cm
Single Plane A2C EF: 53.1 %
Single Plane A4C EF: 57 %

## 2021-09-16 NOTE — Progress Notes (Signed)
  Echocardiogram 2D Echocardiogram has been performed.  Brandon [name] 09/16/2021, 11:47 AM

## 2021-09-17 ENCOUNTER — Encounter (HOSPITAL_COMMUNITY): Payer: Self-pay | Admitting: *Deleted

## 2021-09-26 ENCOUNTER — Encounter: Payer: Self-pay | Admitting: Cardiology

## 2021-09-26 ENCOUNTER — Ambulatory Visit (INDEPENDENT_AMBULATORY_CARE_PROVIDER_SITE_OTHER): Payer: Medicare Other | Admitting: Cardiology

## 2021-09-26 ENCOUNTER — Encounter (HOSPITAL_COMMUNITY): Payer: Self-pay

## 2021-09-26 ENCOUNTER — Other Ambulatory Visit: Payer: Self-pay

## 2021-09-26 VITALS — BP 116/64 | HR 58 | Ht 72.0 in | Wt 202.4 lb

## 2021-09-26 DIAGNOSIS — I480 Paroxysmal atrial fibrillation: Secondary | ICD-10-CM

## 2021-09-26 MED ORDER — APIXABAN 5 MG PO TABS: 5.0000 mg | ORAL_TABLET | Freq: Two times a day (BID) | ORAL | 11 refills | Status: DC

## 2021-09-26 MED ORDER — METOPROLOL SUCCINATE ER 25 MG PO TB24: 25.0000 mg | ORAL_TABLET | Freq: Every day | ORAL | 3 refills | Status: DC

## 2021-09-26 NOTE — Progress Notes (Signed)
Electrophysiology Office Note:    Date:  09/26/2021   ID:  Brandon [name], DOB 1950-12-15, MRN [id number]  PCP:  Aurea Graff.Marlou Sa, MD  [facility] Cardiologist:  None  [facility] Electrophysiologist:  None   Referring MD: Oliver Barre, PA   Chief Complaint: Atrial fibrillation  History of Present Illness:    Brandon [name] is a 70 y.o. male who presents for an evaluation of atrial fibrillation at the request of Adline Peals, PA-C. Their medical history includes colon polyps, hyperlipidemia, diverticulosis.  The patient last saw Adline Peals on September 13, 2021.  The patient's atrial fibrillation diagnosis dates back to June 2017.  He did well on an as needed beta-blocker for quite some time but has recently developed more frequent episodes of atrial fibrillation.  He presented to the ER earlier in the month of October but converted back to sinus rhythm while in the emergency department.  Today he confirms the above.  He tells me his episodes of atrial fibrillation have become more frequent.  He is currently taking metoprolol tartrate 25 twice daily and thinks this is helped the overall burden of his A. fib.  He monitors his heart rate using a Fitbit.  He tells me that when he is in atrial fibrillation he feels palpitations and feels this heart rate speed up into the 150s or 160s.  No syncope or presyncope.  He goes and lays down each time it happens and that seems to help atrial fibrillation.  He is a retired [employer] examiner.  He is very active and walks daily for exercise.      Past Medical History:  Diagnosis Date   Allergy    pollen this yr 2020    Diverticulosis    Hx of adenomatous colonic polyps    Hx of atrial fibrillation, no current medication    2018 , one night in hospital , corrected and no further issues   Hyperlipidemia    Irregular heart beats    "for a while"    Past Surgical History:  Procedure Laterality Date   [place]   POLYPECTOMY      Current Medications: Current Meds  Medication Sig   aspirin EC 81 MG tablet Take 81 mg by mouth daily. Swallow whole.   atorvastatin (LIPITOR) 40 MG tablet Take 40 mg by mouth daily.   metoprolol tartrate (LOPRESSOR) 25 MG tablet Take 1 tablet (25 mg total) by mouth 2 (two) times daily.   Multiple Vitamin (THERA) TABS Take by mouth.   OMEGA 3 1000 MG CAPS Take 2,000 mg by mouth daily.    Red Yeast Rice 600 MG TABS Take 2 tablets by mouth daily.   sildenafil (REVATIO) 20 MG tablet Take by mouth.   Tart Cherry 1200 MG CAPS Take 2 tablets by mouth daily.     Allergies:   Orange concentrate [employer] agent]   Social History   Socioeconomic History   Marital status: Single    Spouse name: Not on file   Number of children: Not on file   Years of education: Not on file   Highest education level: Not on file  Occupational History   Not on file  Tobacco Use   Smoking status: Former    Packs/day: 0.25    Types: Cigarettes    Quit date: 05/01/2018    Years since  quitting: 3.4   Smokeless tobacco: Current    Types: Snuff   Tobacco comments:    smokes 3 cigarettes a day  Substance and Sexual Activity   Alcohol use: Yes    Alcohol/week: 14.0 standard drinks    Types: 14 Glasses of wine per week    Comment: or less    Drug use: No   Sexual activity: Not on file  Other Topics Concern   Not on file  Social History Narrative   Not on file   Social Determinants of Health   Financial Resource Strain: Not on file  Food Insecurity: Not on file  Transportation Needs: Not on file  Physical Activity: Not on file  Stress: Not on file  Social Connections: Not on file     Family History: The patient's family history includes Diabetes in his father; Heart attack in his brother; Sudden death in his brother. There is no history of Hyperlipidemia, Hypertension, Colon cancer, Colon polyps,  Esophageal cancer, Rectal cancer, or Stomach cancer.  ROS:   Please see the history of present illness.    All other systems reviewed and are negative.  EKGs/Labs/Other Studies Reviewed:    The following studies were reviewed today:  September 16, 2021 echo personally reviewed Left ventricular function normal, 55% Right ventricular function normal Trivial MR Mild to moderate TR      Recent Labs: 09/08/2021: ALT 33; BUN 14; Creatinine, Ser 1.04; Hemoglobin 16.4; Magnesium 2.1; Platelets 200; Potassium 4.1; Sodium 136  Recent Lipid Panel No results found for: CHOL, TRIG, HDL, CHOLHDL, VLDL, LDLCALC, LDLDIRECT  Physical Exam:    VS:  BP 116/64   Pulse (!) 58   Ht 6' (1.829 m)   Wt 202 lb 6.4 oz (91.8 kg)   SpO2 95%   BMI 27.45 kg/m     Wt Readings from Last 3 Encounters:  09/26/21 202 lb 6.4 oz (91.8 kg)  09/13/21 200 lb 3.2 oz (90.8 kg)  09/08/21 200 lb (90.7 kg)     GEN:  Well nourished, well developed in no acute distress HEENT: Normal NECK: No JVD; No carotid bruits LYMPHATICS: No lymphadenopathy CARDIAC: RRR, no murmurs, rubs, gallops RESPIRATORY:  Clear to auscultation without rales, wheezing or rhonchi  ABDOMEN: Soft, non-tender, non-distended MUSCULOSKELETAL:  No edema; No deformity  SKIN: Warm and dry NEUROLOGIC:  Alert and oriented x 3 PSYCHIATRIC:  Normal affect       ASSESSMENT:    1. Paroxysmal atrial fibrillation (HCC)    PLAN:    In order of problems listed above:   #Paroxysmal atrial fibrillation Symptomatic.  Symptoms/episodes are becoming more frequent.  He would like to avoid long-term exposure to antiarrhythmic drugs and is decided to pursue ablation.  I discussed the ablation procedure in detail with the patient including the efficacy, risks and recovery.  He would need a CT PV protocol prior to the procedure.  He will also need to start Eliquis at least 1 month prior to the procedure and continue it for at least 3 to 6 months  afterwards.  I would also plan to continue the metoprolol for at least 3 to 6 months after the ablation.   Risk, benefits, and alternatives to EP study and radiofrequency ablation for afib were also discussed in detail today. These risks include but are not limited to stroke, bleeding, vascular damage, tamponade, perforation, damage to the esophagus, lungs, and other structures, pulmonary vein stenosis, worsening renal function, and death. The patient understands these risk and wishes  to proceed.  We will therefore proceed with catheter ablation at the next available time.  Carto, ICE, anesthesia are requested for the procedure.  Will also obtain CT PV protocol prior to the procedure to exclude LAA thrombus and further evaluate atrial anatomy.   I will also change his metoprolol to Toprol-XL 25 mg by mouth once daily at night.    Total time spent with patient today 65 minutes. This includes reviewing records, evaluating the patient and coordinating care.  Medication Adjustments/Labs and Tests Ordered: Current medicines are reviewed at length with the patient today.  Concerns regarding medicines are outlined above.  No orders of the defined types were placed in this encounter.  No orders of the defined types were placed in this encounter.    Signed, Hilton Cork. Quentin Ore, MD, [facility], [facility] 09/26/2021 8:38 AM    Electrophysiology [facility]

## 2021-09-26 NOTE — Patient Instructions (Addendum)
Medication Instructions:   Your physician has recommended you make the following change in your medication:    STOP METOPROLOL TARTRATE  2.   START taking metoprolol succinate 25 mg-  Take one tablet by mouth daily at bedtime  3.  START Eliquis 5 mg (on October 19, 2021)- Take one tablet by mouth twice a day  4.   WHEN you START your Eliquis you will STOP your ASPIRIN  Lab Work: You will come to the [facility] office December 03, 2020 for pre procedure labs. If you have labs (blood work) drawn today and your tests are completely normal, you will receive your results only by: [place] (if you have MyChart) OR A paper copy in the mail If you have any lab test that is abnormal or we need to change your treatment, we will call you to review the results.  Testing/Procedures: Your physician has recommended that you have an ablation. Catheter ablation is a medical procedure used to treat some cardiac arrhythmias (irregular heartbeats). During catheter ablation, a long, thin, flexible tube is put into a blood vessel in your groin (upper thigh), or neck. This tube is called an ablation catheter. It is then guided to your heart through the blood vessel. Radio frequency waves destroy small areas of heart tissue where abnormal heartbeats may cause an arrhythmia to start. Please see the instruction sheet given to you today.  Follow-Up:  SEE INSTRUCTION LETTER  Cardiac Ablation Cardiac ablation is a procedure to destroy, or ablate, a small amount of heart tissue in very specific places. The heart has many electrical connections. Sometimes these connections are abnormal and can cause the heart to beat very fast or irregularly. Ablating some of the areas that cause problems can improve the heart's rhythm or return it to normal. Ablation may be done for people who: Have Wolff-Parkinson-White syndrome. Have fast heart rhythms (tachycardia). Have taken medicines for an abnormal heart rhythm  (arrhythmia) that were not effective or caused side effects. Have a high-risk heartbeat that may be life-threatening. During the procedure, a small incision is made in the neck or the groin, and a long, thin tube (catheter) is inserted into the incision and moved to the heart. Small devices (electrodes) on the tip of the catheter will send out electrical currents. A type of X-ray (fluoroscopy) will be used to help guide the catheter and to provide images of the heart. Tell a health care provider about: Any allergies you have. All medicines you are taking, including vitamins, herbs, eye drops, creams, and over-the-counter medicines. Any problems you or family members have had with anesthetic medicines. Any blood disorders you have. Any surgeries you have had. Any medical conditions you have, such as kidney failure. Whether you are pregnant or may be pregnant. What are the risks? Generally, this is a safe procedure. However, problems may occur, including: Infection. Bruising and bleeding at the catheter insertion site. Bleeding into the chest, especially into the sac that surrounds the heart. This is a serious complication. Stroke or blood clots. Damage to nearby structures or organs. Allergic reaction to medicines or dyes. Need for a permanent pacemaker if the normal electrical system is damaged. A pacemaker is a small computer that sends electrical signals to the heart and helps your heart beat normally. The procedure not being fully effective. This may not be recognized until months later. Repeat ablation procedures are sometimes done. What happens before the procedure? Medicines Ask your health care provider about: Changing or stopping your  regular medicines. This is especially important if you are taking diabetes medicines or blood thinners. Taking medicines such as aspirin and ibuprofen. These medicines can thin your blood. Do not take these medicines unless your health care provider  tells you to take them. Taking over-the-counter medicines, vitamins, herbs, and supplements. General instructions Follow instructions from your health care provider about eating or drinking restrictions. Plan to have someone take you home from the hospital or clinic. If you will be going home right after the procedure, plan to have someone with you for 24 hours. Ask your health care provider what steps will be taken to prevent infection. What happens during the procedure?  An IV will be inserted into one of your veins. You will be given a medicine to help you relax (sedative). The skin on your neck or groin will be numbed. An incision will be made in your neck or your groin. A needle will be inserted through the incision and into a large vein in your neck or groin. A catheter will be inserted into the needle and moved to your heart. Dye may be injected through the catheter to help your surgeon see the area of the heart that needs treatment. Electrical currents will be sent from the catheter to ablate heart tissue in desired areas. There are three types of energy that may be used to do this: Heat (radiofrequency energy). Laser energy. Extreme cold (cryoablation). When the tissue has been ablated, the catheter will be removed. Pressure will be held on the insertion area to prevent a lot of bleeding. A bandage (dressing) will be placed over the insertion area. The exact procedure may vary among health care providers and hospitals. What happens after the procedure? Your blood pressure, heart rate, breathing rate, and blood oxygen level will be monitored until you leave the hospital or clinic. Your insertion area will be monitored for bleeding. You will need to lie still for a few hours to ensure that you do not bleed from the insertion area. Do not drive for 24 hours or as long as told by your health care provider. Summary Cardiac ablation is a procedure to destroy, or ablate, a small amount  of heart tissue using an electrical current. This procedure can improve the heart rhythm or return it to normal. Tell your health care provider about any medical conditions you may have and all medicines you are taking to treat them. This is a safe procedure, but problems may occur. Problems may include infection, bruising, damage to nearby organs or structures, or allergic reactions to medicines. Follow your health care provider's instructions about eating and drinking before the procedure. You may also be told to change or stop some of your medicines. After the procedure, do not drive for 24 hours or as long as told by your health care provider. This information is not intended to replace advice given to you by your health care provider. Make sure you discuss any questions you have with your health care provider. Document Revised: 09/26/2019 Document Reviewed: 09/26/2019 Elsevier Patient Education  [place].

## 2021-10-09 ENCOUNTER — Institutional Professional Consult (permissible substitution): Payer: BLUE CROSS/BLUE SHIELD | Admitting: Cardiology

## 2021-10-15 ENCOUNTER — Telehealth: Payer: Self-pay | Admitting: Cardiology

## 2021-10-15 NOTE — Telephone Encounter (Signed)
New message   Pt wants to know if it's possible to push afib ablation out a week or so. He is currently scheduled 01.19.23

## 2021-10-15 NOTE — Telephone Encounter (Signed)
Pt does not want to delay procedure to end of February.  Does not want to reschedule.

## 2021-10-16 ENCOUNTER — Other Ambulatory Visit: Payer: Self-pay

## 2021-10-16 ENCOUNTER — Telehealth: Payer: Self-pay | Admitting: Cardiology

## 2021-10-16 MED ORDER — APIXABAN 5 MG PO TABS: 5.0000 mg | ORAL_TABLET | Freq: Two times a day (BID) | ORAL | 11 refills | Status: DC

## 2021-10-16 NOTE — Telephone Encounter (Signed)
Prescription refill request for Eliquis received. Indication:Afib Last office visit:10/22 Scr:1.0 Age: 70 Weight:91.8 kg  Prescription refilled

## 2021-10-16 NOTE — Telephone Encounter (Signed)
Patient calling the office for samples of medication:   1.  What medication and dosage are you requesting samples for? apixaban (ELIQUIS) 5 MG TABS tablet  2.  Are you currently out of this medication? PT NEEDS 3 DAYS SUPPLY OF [facility] [place]

## 2021-10-21 ENCOUNTER — Ambulatory Visit (INDEPENDENT_AMBULATORY_CARE_PROVIDER_SITE_OTHER): Payer: Medicare Other | Admitting: Internal Medicine

## 2021-10-21 ENCOUNTER — Other Ambulatory Visit: Payer: Self-pay

## 2021-10-21 ENCOUNTER — Encounter: Payer: Self-pay | Admitting: Internal Medicine

## 2021-10-21 VITALS — BP 116/64 | HR 64 | Ht 72.0 in | Wt 197.0 lb

## 2021-10-21 DIAGNOSIS — I480 Paroxysmal atrial fibrillation: Secondary | ICD-10-CM

## 2021-10-21 NOTE — Patient Instructions (Signed)
Medication Instructions:  Continue same medications *If you need a refill on your cardiac medications before your next appointment, please call your pharmacy*   Lab Work: None ordered   Testing/Procedures: None ordered   Follow-Up: At [facility], you and your health needs are our priority.  As part of our continuing mission to provide you with exceptional heart care, we have created designated Provider Care Teams.  These Care Teams include your primary Cardiologist (physician) and Advanced Practice Providers (APPs -  Physician Assistants and Nurse Practitioners) who all work together to provide you with the care you need, when you need it.  We recommend signing up for the patient portal called "MyChart".  Sign up information is provided on this After Visit Summary.  MyChart is used to connect with patients for Virtual Visits (Telemedicine).  Patients are able to view lab/test results, encounter notes, upcoming appointments, etc.  Non-urgent messages can be sent to your provider as well.   To learn more about what you can do with MyChart, go to [email or web address].      Your next appointment:  3 months    The format for your next appointment: Office   Provider:  Dr.Branch

## 2021-10-21 NOTE — Progress Notes (Addendum)
Cardiology Office Note:    Date:  10/21/2021   ID:  WRIGHT GRAVELY, DOB Feb 22, 1951, MRN [id number]  PCP:  Aurea Graff.Marlou Sa, MD   [facility] Cardiologist:  None     Referring MD: Charlann Lange, PA-C   No chief complaint on file. Atrial Fibrillation  History of Present Illness:    Brandon [name] is a 70 y.o. male with a hx of HLD, ETOH use, diverticulosis, paroxysmal atrial fibrillation CHADS2VASC 1 seen by afib clinic planned for PVI, referral to establish care  Brandon [name] went to the ED 09/08/2021 for evaulation of atrial fibrillation. He noted that his symptoms of elevated heart rates occure  more frequently. He was seen by Afib clinic 10/14, 09/26/2021  and they discussed PVI. Getting CT to assess pulmonary vein anatomy. He was started on eliquis prior to his procedure started on 10/19/2021. The plan is to continue 3 to 6 months afterwards.  No hx of sleep apnea. No exertional chest pain or shortness of breath. No orthopnea or PND.  Prior smoking, takes nicotine pouches.   Diagnosed in 2017, presented with dizziness found to be in Afib with RVR and started on a cardizem gtt. He had demand ischemia. CHADSC2VASC 1. Did not require anticoagulation  03/18/2021 TSH 2.8  09/08/2021 Crt 1.04 Hgb 16 LDL 93 TC 169   Cardiology Studies: 1017/2022-Normal LV fxn, RV fxn Mild to moderate TR, RVSP 29 mmHg No Brandon/MS. Normal left atrium  ETT- no ischemia  EKG 09/16/2021- Afib with RVR 120s   Past Medical History:  Diagnosis Date   Allergy    pollen this yr 2020    Diverticulosis    Hx of adenomatous colonic polyps    Hx of atrial fibrillation, no current medication    2018 , one night in hospital , corrected and no further issues   Hyperlipidemia    Irregular heart beats    "for a while"    Past Surgical History:  Procedure Laterality Date   [place]   POLYPECTOMY       Current Medications: No outpatient medications have been marked as taking for the 10/21/21 encounter (Appointment) with Janina Mayo, MD.     Allergies:   Orange concentrate [flavoring agent]   Social History   Socioeconomic History   Marital status: Single    Spouse name: Not on file   Number of children: Not on file   Years of education: Not on file   Highest education level: Not on file  Occupational History   Not on file  Tobacco Use   Smoking status: Former    Packs/day: 0.25    Types: Cigarettes    Quit date: 05/01/2018    Years since quitting: 3.4   Smokeless tobacco: Current    Types: Snuff   Tobacco comments:    smokes 3 cigarettes a day  Substance and Sexual Activity   Alcohol use: Yes    Alcohol/week: 14.0 standard drinks    Types: 14 Glasses of wine per week    Comment: or less    Drug use: No   Sexual activity: Not on file  Other Topics Concern   Not on file  Social History Narrative   Not on file   Social Determinants of Health   Financial Resource Strain: Not on file  Food Insecurity: Not on file  Transportation Needs: Not on  file  Physical Activity: Not on file  Stress: Not on file  Social Connections: Not on file     Family History: The patient's family history includes Diabetes in his father; Heart attack in his brother; Sudden death in his brother. There is no history of Hyperlipidemia, Hypertension, Colon cancer, Colon polyps, Esophageal cancer, Rectal cancer, or Stomach cancer.  ROS:   Please see the history of present illness.     All other systems reviewed and are negative.  EKGs/Labs/Other Studies Reviewed:    The following studies were reviewed today:   EKG:  EKG is  ordered today.  The ekg ordered today demonstrates  NSR, Qtc 431 ms  Recent Labs: 09/08/2021: ALT 33; BUN 14; Creatinine, Ser 1.04; Hemoglobin 16.4; Magnesium 2.1; Platelets 200; Potassium 4.1; Sodium 136  Recent Lipid Panel No results found for: CHOL, TRIG,  HDL, CHOLHDL, VLDL, LDLCALC, LDLDIRECT   Risk Assessment/Calculations:    CHA2DS2-VASc Score = 1   This indicates a 0.6% annual risk of stroke. The patient's score is based upon: CHF History: 0 HTN History: 0 Diabetes History: 0 Stroke History: 0 Vascular Disease History: 0 Age Score: 1 Gender Score: 0           Physical Exam:    VS:   Vitals:   10/21/21 1357  BP: 116/64  Pulse: 64  SpO2: 95%     Wt Readings from Last 3 Encounters:  09/26/21 202 lb 6.4 oz (91.8 kg)  09/13/21 200 lb 3.2 oz (90.8 kg)  09/08/21 200 lb (90.7 kg)     GEN:  Well nourished, well developed in no acute distress. Looks younger than stated age 57: Normal NECK: No JVD; No carotid bruits LYMPHATICS: No lymphadenopathy CARDIAC: RRR, no murmurs, rubs, gallops RESPIRATORY:  Clear to auscultation without rales, wheezing or rhonchi  ABDOMEN: Soft, non-tender, non-distended MUSCULOSKELETAL:  No edema; No deformity  SKIN: Warm and dry NEUROLOGIC:  Alert and oriented x 3 PSYCHIATRIC:  Normal affect   ASSESSMENT:    #Paroxysmal Atrial Fibrillation: asymptomatic. CHADS2VASC 1. He is on metoprolol XL 25 mg tolerating well. Plan for PVI in the next couple of months with EP. He has no structural heart dx. His plan is to continue eliquis now and  3-6 months after the procedure. He notes he is going out of [place] next [place]. Can plan to see him post PVI.  PLAN:    In order of problems listed above:  No changes in medications Follow up 3 months      Medication Adjustments/Labs and Tests Ordered: Current medicines are reviewed at length with the patient today.  Concerns regarding medicines are outlined above.    Signed, Janina Mayo, MD  10/21/2021 12:22 PM    [facility]

## 2021-12-03 ENCOUNTER — Other Ambulatory Visit: Payer: Medicare Other | Admitting: *Deleted

## 2021-12-03 ENCOUNTER — Other Ambulatory Visit: Payer: Self-pay

## 2021-12-03 DIAGNOSIS — I480 Paroxysmal atrial fibrillation: Secondary | ICD-10-CM

## 2021-12-03 LAB — BASIC METABOLIC PANEL
BUN/Creatinine Ratio: 14 (ref 10–24)
BUN: 13 mg/dL (ref 8–27)
CO2: 25 mmol/L (ref 20–29)
Calcium: 9.7 mg/dL (ref 8.6–10.2)
Chloride: 104 mmol/L (ref 96–106)
Creatinine, Ser: 0.91 mg/dL (ref 0.76–1.27)
Glucose: 91 mg/dL (ref 70–99)
Potassium: 4.7 mmol/L (ref 3.5–5.2)
Sodium: 140 mmol/L (ref 134–144)
eGFR: 92 mL/min/{1.73_m2} (ref >59)

## 2021-12-03 LAB — CBC WITH DIFFERENTIAL/PLATELET
Basophils Absolute: 0 10*3/uL (ref 0.0–0.2)
Basos: 1 %
EOS (ABSOLUTE): 0.1 10*3/uL (ref 0.0–0.4)
Eos: 2 %
Hematocrit: 42.1 % (ref 37.5–51.0)
Hemoglobin: 14.3 g/dL (ref 13.0–17.7)
Immature Grans (Abs): 0 10*3/uL (ref 0.0–0.1)
Immature Granulocytes: 0 %
Lymphocytes Absolute: 2.3 10*3/uL (ref 0.7–3.1)
Lymphs: 40 %
MCH: 30.9 pg (ref 26.6–33.0)
MCHC: 34 g/dL (ref 31.5–35.7)
MCV: 91 fL (ref 79–97)
Monocytes Absolute: 0.7 10*3/uL (ref 0.1–0.9)
Monocytes: 13 %
Neutrophils Absolute: 2.5 10*3/uL (ref 1.4–7.0)
Neutrophils: 44 %
Platelets: 152 10*3/uL (ref 150–450)
RBC: 4.63 x10E6/uL (ref 4.14–5.80)
RDW: 13.3 % (ref 11.6–15.4)
WBC: 5.7 10*3/uL (ref 3.4–10.8)

## 2021-12-11 ENCOUNTER — Telehealth (HOSPITAL_COMMUNITY): Payer: Self-pay | Admitting: Emergency Medicine

## 2021-12-11 NOTE — Telephone Encounter (Signed)
Reaching out to patient to offer assistance regarding upcoming cardiac imaging study; pt verbalizes understanding of appt date/time, parking situation and where to check in, pre-test NPO status and medications ordered, and verified current allergies; name and call back number provided for further questions should they arise Brandon Bond RN Navigator Cardiac Imaging [name] [phone] office [phone] cell  Denies iv issues Daily meds Arrival 1:00p

## 2021-12-11 NOTE — Telephone Encounter (Signed)
Attempted to call patient regarding upcoming cardiac CT appointment. °Left message on voicemail with name and callback number °Shiloh Southern RN Navigator Cardiac Imaging ° [facility] °[phone] Office °[phone] Cell ° °

## 2021-12-12 ENCOUNTER — Ambulatory Visit (HOSPITAL_COMMUNITY)
Admission: RE | Admit: 2021-12-12 | Discharge: 2021-12-12 | Disposition: A | Discharge status: Home / Self Care | Payer: Medicare Other | Source: Ambulatory Visit | Attending: Cardiology | Admitting: Cardiology

## 2021-12-12 ENCOUNTER — Other Ambulatory Visit: Payer: Self-pay

## 2021-12-12 DIAGNOSIS — I480 Paroxysmal atrial fibrillation: Secondary | ICD-10-CM | POA: Diagnosis present

## 2021-12-12 MED ORDER — IOHEXOL 350 MG/ML SOLN
95.0000 mL | Freq: Once | INTRAVENOUS | Status: AC | PRN
  Administered 2021-12-12: 95 mL via INTRAVENOUS

## 2021-12-18 NOTE — Pre-Procedure Instructions (Signed)
Instructed patient on the following items: Arrival time 0830 Nothing to eat or drink after midnight No meds AM of procedure Responsible person to drive you home and stay with you for 24 hrs  Have you missed any doses of anti-coagulant Eliquis- hasn't missed any doses   

## 2021-12-19 ENCOUNTER — Ambulatory Visit (HOSPITAL_COMMUNITY)
Admission: RE | Admit: 2021-12-19 | Discharge: 2021-12-19 | Disposition: A | Discharge status: Home / Self Care | Payer: Medicare Other | Attending: Cardiology | Admitting: Cardiology

## 2021-12-19 ENCOUNTER — Ambulatory Visit (HOSPITAL_COMMUNITY)
Admission: RE | Disposition: A | Discharge status: Home / Self Care | Payer: BLUE CROSS/BLUE SHIELD | Source: Home / Self Care | Attending: Cardiology

## 2021-12-19 ENCOUNTER — Other Ambulatory Visit: Payer: Self-pay

## 2021-12-19 ENCOUNTER — Encounter (HOSPITAL_COMMUNITY): Payer: Self-pay | Admitting: Cardiology

## 2021-12-19 ENCOUNTER — Ambulatory Visit (HOSPITAL_COMMUNITY): Payer: Medicare Other | Admitting: Certified Registered"

## 2021-12-19 DIAGNOSIS — E785 Hyperlipidemia, unspecified: Secondary | ICD-10-CM | POA: Insufficient documentation

## 2021-12-19 DIAGNOSIS — I480 Paroxysmal atrial fibrillation: Secondary | ICD-10-CM | POA: Diagnosis present

## 2021-12-19 DIAGNOSIS — Z7901 Long term (current) use of anticoagulants: Secondary | ICD-10-CM | POA: Insufficient documentation

## 2021-12-19 DIAGNOSIS — Z79899 Other long term (current) drug therapy: Secondary | ICD-10-CM | POA: Diagnosis not present

## 2021-12-19 HISTORY — PX: ATRIAL FIBRILLATION ABLATION: EP1191

## 2021-12-19 LAB — POCT ACTIVATED CLOTTING TIME
Activated Clotting Time: 311 s
Activated Clotting Time: 311 s

## 2021-12-19 SURGERY — ATRIAL FIBRILLATION ABLATION
Anesthesia: General

## 2021-12-19 MED ORDER — HEPARIN (PORCINE) IN NACL 1000-0.9 UT/500ML-% IV SOLN
INTRAVENOUS | Status: AC
  Filled 2021-12-19: qty 2000

## 2021-12-19 MED ORDER — HEPARIN SODIUM (PORCINE) 1000 UNIT/ML IJ SOLN
INTRAMUSCULAR | Status: AC
  Filled 2021-12-19: qty 10

## 2021-12-19 MED ORDER — ACETAMINOPHEN 325 MG PO TABS
650.0000 mg | ORAL_TABLET | ORAL | Status: DC | PRN
  Filled 2021-12-19: qty 2

## 2021-12-19 MED ORDER — MIDAZOLAM HCL 2 MG/2ML IJ SOLN
INTRAMUSCULAR | Status: DC | PRN
  Administered 2021-12-19: 2 mg via INTRAVENOUS

## 2021-12-19 MED ORDER — FENTANYL CITRATE (PF) 250 MCG/5ML IJ SOLN
INTRAMUSCULAR | Status: DC | PRN
  Administered 2021-12-19 (×2): 50 ug via INTRAVENOUS

## 2021-12-19 MED ORDER — ISOPROTERENOL HCL 0.2 MG/ML IJ SOLN
INTRAVENOUS | Status: DC | PRN
  Administered 2021-12-19: 4 ug/min via INTRAVENOUS

## 2021-12-19 MED ORDER — DEXAMETHASONE SODIUM PHOSPHATE 10 MG/ML IJ SOLN
INTRAMUSCULAR | Status: DC | PRN
  Administered 2021-12-19: 10 mg via INTRAVENOUS

## 2021-12-19 MED ORDER — ACETAMINOPHEN 500 MG PO TABS
ORAL_TABLET | ORAL | Status: AC
  Filled 2021-12-19: qty 2

## 2021-12-19 MED ORDER — SODIUM CHLORIDE 0.9% FLUSH: 3.0000 mL | INTRAVENOUS | Status: DC | PRN

## 2021-12-19 MED ORDER — LIDOCAINE 2% (20 MG/ML) 5 ML SYRINGE
INTRAMUSCULAR | Status: DC | PRN
  Administered 2021-12-19: 60 mg via INTRAVENOUS

## 2021-12-19 MED ORDER — PHENYLEPHRINE HCL-NACL 20-0.9 MG/250ML-% IV SOLN
INTRAVENOUS | Status: DC | PRN
  Administered 2021-12-19: 25 ug/min via INTRAVENOUS

## 2021-12-19 MED ORDER — PROTAMINE SULFATE 10 MG/ML IV SOLN
INTRAVENOUS | Status: DC | PRN
Start: 2021-12-19 — End: 2021-12-19
  Administered 2021-12-19: 35 mg via INTRAVENOUS

## 2021-12-19 MED ORDER — APIXABAN 5 MG PO TABS
5.0000 mg | ORAL_TABLET | Freq: Two times a day (BID) | ORAL | Status: DC
  Administered 2021-12-19: 5 mg via ORAL
  Filled 2021-12-19: qty 1

## 2021-12-19 MED ORDER — ONDANSETRON HCL 4 MG/2ML IJ SOLN
INTRAMUSCULAR | Status: DC | PRN
  Administered 2021-12-19: 4 mg via INTRAVENOUS

## 2021-12-19 MED ORDER — ISOPROTERENOL HCL 0.2 MG/ML IJ SOLN
INTRAMUSCULAR | Status: AC
  Filled 2021-12-19: qty 5

## 2021-12-19 MED ORDER — SODIUM CHLORIDE 0.9 % IV SOLN: INTRAVENOUS | Status: DC

## 2021-12-19 MED ORDER — SODIUM CHLORIDE 0.9 % IV SOLN: 250.0000 mL | INTRAVENOUS | Status: DC | PRN

## 2021-12-19 MED ORDER — SODIUM CHLORIDE 0.9% FLUSH: 3.0000 mL | Freq: Two times a day (BID) | INTRAVENOUS | Status: DC

## 2021-12-19 MED ORDER — SUGAMMADEX SODIUM 200 MG/2ML IV SOLN
INTRAVENOUS | Status: DC | PRN
  Administered 2021-12-19: 200 mg via INTRAVENOUS

## 2021-12-19 MED ORDER — PROPOFOL 10 MG/ML IV BOLUS
INTRAVENOUS | Status: DC | PRN
  Administered 2021-12-19: 200 mg via INTRAVENOUS

## 2021-12-19 MED ORDER — ACETAMINOPHEN 500 MG PO TABS
1000.0000 mg | ORAL_TABLET | Freq: Once | ORAL | Status: AC
Start: 2021-12-19 — End: 2021-12-19
  Administered 2021-12-19: 1000 mg via ORAL
  Filled 2021-12-19: qty 2

## 2021-12-19 MED ORDER — ONDANSETRON HCL 4 MG/2ML IJ SOLN: 4.0000 mg | Freq: Four times a day (QID) | INTRAMUSCULAR | Status: DC | PRN

## 2021-12-19 MED ORDER — HEPARIN (PORCINE) IN NACL 1000-0.9 UT/500ML-% IV SOLN
INTRAVENOUS | Status: DC | PRN
  Administered 2021-12-19 (×3): 500 mL

## 2021-12-19 MED ORDER — ROCURONIUM BROMIDE 10 MG/ML (PF) SYRINGE
PREFILLED_SYRINGE | INTRAVENOUS | Status: DC | PRN
  Administered 2021-12-19: 20 mg via INTRAVENOUS
  Administered 2021-12-19: 60 mg via INTRAVENOUS

## 2021-12-19 MED ORDER — HEPARIN SODIUM (PORCINE) 1000 UNIT/ML IJ SOLN
INTRAMUSCULAR | Status: DC | PRN
Start: 2021-12-19 — End: 2021-12-19
  Administered 2021-12-19: 3000 [IU] via INTRAVENOUS
  Administered 2021-12-19: 13000 [IU] via INTRAVENOUS

## 2021-12-19 SURGICAL SUPPLY — 18 items
CATH 8FR REPROCESSED SOUNDSTAR (CATHETERS) ×2 IMPLANT
CATH 8FR SOUNDSTAR REPROCESSED (CATHETERS) IMPLANT
CATH OCTARAY 2.0 F 3-3-3-3-3 (CATHETERS) ×1 IMPLANT
CATH S CIRCA THERM PROBE 10F (CATHETERS) ×1 IMPLANT
CATH SMTCH THERMOCOOL SF DF (CATHETERS) ×1 IMPLANT
CATH WEB BI DIR CSDF CRV REPRO (CATHETERS) ×1 IMPLANT
CLOSURE PERCLOSE PROSTYLE (VASCULAR PRODUCTS) ×3 IMPLANT
COVER SWIFTLINK CONNECTOR (BAG) ×3 IMPLANT
PACK EP LATEX FREE (CUSTOM PROCEDURE TRAY) ×2
PACK EP LF (CUSTOM PROCEDURE TRAY) ×2 IMPLANT
PAD DEFIB RADIO PHYSIO CONN (PAD) ×3 IMPLANT
PATCH CARTO3 (PAD) ×1 IMPLANT
SHEATH BAYLIS TRANSSEPTAL 98CM (NEEDLE) ×1 IMPLANT
SHEATH CARTO VIZIGO SM CVD (SHEATH) ×1 IMPLANT
SHEATH PINNACLE 8F 10CM (SHEATH) ×2 IMPLANT
SHEATH PINNACLE 9F 10CM (SHEATH) ×1 IMPLANT
SHEATH PROBE COVER 6X72 (BAG) ×1 IMPLANT
TUBING SMART ABLATE COOLFLOW (TUBING) ×1 IMPLANT

## 2021-12-19 NOTE — H&P (Signed)
Electrophysiology Office Note:     Date:  12/19/2021    ID:  Brandon [name], DOB [birth year], MRN [id number]   PCP:  Aurea Graff.Marlou Sa, MD           [facility] Cardiologist:  None  [facility] Electrophysiologist:  None    Referring MD: Brandon Barre, PA    Chief Complaint: Atrial fibrillation   History of Present Illness:     Brandon [name] is a 71 y.o. male who presents for an evaluation of atrial fibrillation at the request of Brandon Peals, PA-C. Their medical history includes colon polyps, hyperlipidemia, diverticulosis.  The patient last saw Brandon [name] on September 13, 2021.  The patient's atrial fibrillation diagnosis dates back to June 2017.  He did well on an as needed beta-blocker for quite some time but has recently developed more frequent episodes of atrial fibrillation.  He presented to the ER earlier in the month of October but converted back to sinus rhythm while in the emergency department.   Today he confirms the above.  He tells me his episodes of atrial fibrillation have become more frequent.  He is currently taking metoprolol tartrate 25 twice daily and thinks this is helped the overall burden of his A. fib.  He monitors his heart rate using a Fitbit.  He tells me that when he is in atrial fibrillation he feels palpitations and feels this heart rate speed up into the 150s or 160s.  No syncope or presyncope.  He goes and lays down each time it happens and that seems to help atrial fibrillation.  He is a retired [employer] examiner.  He is very active and walks daily for exercise.         Objective        Past Medical History:  Diagnosis Date   Allergy      pollen this yr 2020    Diverticulosis     Hx of adenomatous colonic polyps     Hx of atrial fibrillation, no current medication      2018 , one night in hospital , corrected and no further issues   Hyperlipidemia     Irregular heart beats      "for a while"           Past Surgical History:   Procedure Laterality Date   [place]   POLYPECTOMY          Current Medications: Active Medications      Current Meds  Medication Sig   aspirin EC 81 MG tablet Take 81 mg by mouth daily. Swallow whole.   atorvastatin (LIPITOR) 40 MG tablet Take 40 mg by mouth daily.   metoprolol tartrate (LOPRESSOR) 25 MG tablet Take 1 tablet (25 mg total) by mouth 2 (two) times daily.   Multiple Vitamin (THERA) TABS Take by mouth.   OMEGA 3 1000 MG CAPS Take 2,000 mg by mouth daily.    Red Yeast Rice 600 MG TABS Take 2 tablets by mouth daily.   sildenafil (REVATIO) 20 MG tablet Take by mouth.   Tart Cherry 1200 MG CAPS Take 2 tablets by mouth daily.        Allergies:   Orange concentrate [employer] agent]    Social History         Socioeconomic History   Marital status:  Single      Spouse name: Not on file   Number of children: Not on file   Years of education: Not on file   Highest education level: Not on file  Occupational History   Not on file  Tobacco Use   Smoking status: Former      Packs/day: 0.25      Types: Cigarettes      Quit date: 05/01/2018      Years since quitting: 3.4   Smokeless tobacco: Current      Types: Snuff   Tobacco comments:      smokes 3 cigarettes a day  Substance and Sexual Activity   Alcohol use: Yes      Alcohol/week: 14.0 standard drinks      Types: 14 Glasses of wine per week      Comment: or less    Drug use: No   Sexual activity: Not on file  Other Topics Concern   Not on file  Social History Narrative   Not on file    Social Determinants of Health    Financial Resource Strain: Not on file  Food Insecurity: Not on file  Transportation Needs: Not on file  Physical Activity: Not on file  Stress: Not on file  Social Connections: Not on file      Family History: The patient's family history includes Diabetes in his father; Heart attack in his  brother; Sudden death in his brother. There is no history of Hyperlipidemia, Hypertension, Colon cancer, Colon polyps, Esophageal cancer, Rectal cancer, or Stomach cancer.   ROS:   Please see the history of present illness.    All other systems reviewed and are negative.   EKGs/Labs/Other Studies Reviewed:     The following studies were reviewed today:   September 16, 2021 echo personally reviewed Left ventricular function normal, 55% Right ventricular function normal Trivial MR Mild to moderate TR           Recent Labs: 09/08/2021: ALT 33; BUN 14; Creatinine, Ser 1.04; Hemoglobin 16.4; Magnesium 2.1; Platelets 200; Potassium 4.1; Sodium 136  Recent Lipid Panel Labs (Brief)  No results found for: CHOL, TRIG, HDL, CHOLHDL, VLDL, LDLCALC, LDLDIRECT     Physical Exam:     VS:  BP 116/64    Pulse (!) 58    Ht 6' (1.829 m)    Wt 202 lb 6.4 oz (91.8 kg)    SpO2 95%    BMI 27.45 kg/m         Wt Readings from Last 3 Encounters:  09/26/21 202 lb 6.4 oz (91.8 kg)  09/13/21 200 lb 3.2 oz (90.8 kg)  09/08/21 200 lb (90.7 kg)      GEN:  Well nourished, well developed in no acute distress HEENT: Normal NECK: No JVD; No carotid bruits LYMPHATICS: No lymphadenopathy CARDIAC: RRR, no murmurs, rubs, gallops RESPIRATORY:  Clear to auscultation without rales, wheezing or rhonchi  ABDOMEN: Soft, non-tender, non-distended MUSCULOSKELETAL:  No edema; No deformity  SKIN: Warm and dry NEUROLOGIC:  Alert and oriented x 3 PSYCHIATRIC:  Normal affect          Assessment     ASSESSMENT:     1. Paroxysmal atrial fibrillation (HCC)     PLAN:     In order of problems listed above:     #Paroxysmal atrial fibrillation Symptomatic.  Symptoms/episodes are becoming more frequent.  He would like to avoid long-term exposure to antiarrhythmic drugs and is decided to pursue ablation.  I discussed the ablation procedure in detail with the patient including the efficacy, risks and recovery.  He  would need a CT PV protocol prior to the procedure.  He will also need to start Eliquis at least 1 month prior to the procedure and continue it for at least 3 to 6 months afterwards.  I would also plan to continue the metoprolol for at least 3 to 6 months after the ablation.    Risk, benefits, and alternatives to EP study and radiofrequency ablation for afib were also discussed in detail today. These risks include but are not limited to stroke, bleeding, vascular damage, tamponade, perforation, damage to the esophagus, lungs, and other structures, pulmonary vein stenosis, worsening renal function, and death. The patient understands these risk and wishes to proceed.  We will therefore proceed with catheter ablation at the next available time.  Carto, ICE, anesthesia are requested for the procedure.  Will also obtain CT PV protocol prior to the procedure to exclude LAA thrombus and further evaluate atrial anatomy.     I will also change his metoprolol to Toprol-XL 25 mg by mouth once daily at night.       Total time spent with patient today 65 minutes. This includes reviewing records, evaluating the patient and coordinating care.    ---------------------  I have seen, examined the patient, and reviewed the above assessment and plan.    Plan for PVI today.   Vickie Epley, MD 12/19/2021 10:57 AM

## 2021-12-19 NOTE — Anesthesia Preprocedure Evaluation (Addendum)
Anesthesia Evaluation  Patient identified by MRN, date of birth, ID band Patient awake    Reviewed: Allergy & Precautions, NPO status , Patient's Chart, lab work & pertinent test results, reviewed documented beta blocker date and time   History of Anesthesia Complications Negative for: history of anesthetic complications  Airway Mallampati: II  TM Distance: >3 FB Neck ROM: Full    Dental  (+) Dental Advisory Given   Pulmonary former smoker,    Pulmonary exam normal        Cardiovascular + dysrhythmias Atrial Fibrillation + Valvular Problems/Murmurs  Rhythm:Irregular Rate:Bradycardia   '22 TTE - EF 55 to 60%. Trivial MR. Tricuspid valve regurgitation is mild to moderate. Mild aortic valve sclerosis is present, with no evidence of aortic valve stenosis.     Neuro/Psych negative neurological ROS  negative psych ROS   GI/Hepatic negative GI ROS, (+)     substance abuse  alcohol use,   Endo/Other  negative endocrine ROS  Renal/GU negative Renal ROS     Musculoskeletal negative musculoskeletal ROS (+)   Abdominal   Peds  Hematology  On eliquis    Anesthesia Other Findings   Reproductive/Obstetrics                            Anesthesia Physical Anesthesia Plan  ASA: 3  Anesthesia Plan: General   Post-op Pain Management: Tylenol PO (pre-op)   Induction: Intravenous  PONV Risk Score and Plan: 2 and Treatment may vary due to age or medical condition, Ondansetron and Dexamethasone  Airway Management Planned: Oral ETT  Additional Equipment: None  Intra-op Plan:   Post-operative Plan: Extubation in OR  Informed Consent: I have reviewed the patients History and Physical, chart, labs and discussed the procedure including the risks, benefits and alternatives for the proposed anesthesia with the patient or authorized representative who has indicated his/her understanding and acceptance.      Dental advisory given  Plan Discussed with: CRNA and Anesthesiologist  Anesthesia Plan Comments:        Anesthesia Quick Evaluation

## 2021-12-19 NOTE — Transfer of Care (Signed)
Immediate Anesthesia Transfer of Care Note  Patient: Brandon [name]  Procedure(s) Performed: ATRIAL FIBRILLATION ABLATION  Patient Location: Cath Lab  Anesthesia Type:General  Level of Consciousness: awake, alert  and oriented  Airway & Oxygen Therapy: Patient Spontanous Breathing and Patient connected to nasal cannula oxygen  Post-op Assessment: Report given to RN, Post -op Vital signs reviewed and stable and Patient moving all extremities X 4  Post vital signs: Reviewed and stable  Last Vitals:  Vitals Value Taken Time  BP 116/49 12/19/21 1402  Temp    Pulse 62 12/19/21 1402  Resp 17 12/19/21 1402  SpO2 96 % 12/19/21 1402  Vitals shown include unvalidated device data.  Last Pain:  Vitals:   12/19/21 0858  TempSrc:   PainSc: 0-No pain      Patients Stated Pain Goal: 3 (09/26/24 3664)  Complications: No notable events documented.

## 2021-12-19 NOTE — Discharge Instructions (Addendum)
Post procedure care instructions No driving for 4 days. No lifting over 5 lbs for 1 week. No vigorous or sexual activity for 1 week. You may return to work/your usual activities on 12/27/21. Keep procedure site clean & dry. If you notice increased pain, swelling, bleeding or pus, call/return!  You may shower after 24 hours, but no soaking in baths/hot tubs/pools for 1 week.    You have an appointment set up with the [facility].  Multiple studies have shown that being followed by a dedicated atrial fibrillation clinic in addition to the standard care you receive from your other physicians improves health. We believe that enrollment in the atrial fibrillation clinic will allow [place] to better care for you.   The phone number to the [facility] is [phone]. The clinic is staffed Monday through Friday from 8:30am to 5pm.  Parking Directions: The clinic is located in the Heart and Vascular Building connected to [facility]. 1)From [address] turn on to [address] and go to the 3rd entrance  (Heart and Vascular entrance) on the right. 2)Look to the right for Heart &Vascular Parking Garage. 3)A code for the entrance is required, for Feb is 1204.   4)Take the elevators to the 1st floor. Registration is in the room with the glass walls at the end of the hallway.  If you have any trouble parking or locating the clinic, please dont hesitate to call [phone].    Cardiac Ablation, Care After  This sheet gives you information about how to care for yourself after your procedure. Your health care provider may also give you more specific instructions. If you have problems or questions, contact your health care provider. What can I expect after the procedure? After the procedure, it is common to have: Bruising around your puncture site. Tenderness around your puncture site. Skipped heartbeats. Tiredness (fatigue).  Follow these instructions at  home: Puncture site care  Follow instructions from your health care provider about how to take care of your puncture site. Make sure you: If present, leave stitches (sutures), skin glue, or adhesive strips in place. These skin closures may need to stay in place for up to 2 weeks. If adhesive strip edges start to loosen and curl up, you may trim the loose edges. Do not remove adhesive strips completely unless your health care provider tells you to do that. If a large square bandage is present, this may be removed 24 hours after surgery.  Check your puncture site every day for signs of infection. Check for: Redness, swelling, or pain. Fluid or blood. If your puncture site starts to bleed, lie down on your back, apply firm pressure to the area, and contact your health care provider. Warmth. Pus or a bad smell. Driving Do not drive for at least 4 days after your procedure or however long your health care provider recommends. (Do not resume driving if you have previously been instructed not to drive for other health reasons.) Do not drive or use heavy machinery while taking prescription pain medicine. Activity Avoid activities that take a lot of effort for at least 7 days after your procedure. Do not lift anything that is heavier than 5 lb (4.5 kg) for one week.  No sexual activity for 1 week.  Return to your normal activities as told by your health care provider. Ask your health care provider what activities are safe for you. General instructions Take over-the-counter and prescription medicines only as told by your health  care provider. Do not use any products that contain nicotine or tobacco, such as cigarettes and e-cigarettes. If you need help quitting, ask your health care provider. You may shower after 24 hours, but Do not take baths, swim, or use a hot tub for 1 week.  Do not drink alcohol for 24 hours after your procedure. Keep all follow-up visits as told by your health care provider. This  is important. Contact a health care provider if: You have redness, mild swelling, or pain around your puncture site. You have fluid or blood coming from your puncture site that stops after applying firm pressure to the area. Your puncture site feels warm to the touch. You have pus or a bad smell coming from your puncture site. You have a fever. You have chest pain or discomfort that spreads to your neck, jaw, or arm. You are sweating a lot. You feel nauseous. You have a fast or irregular heartbeat. You have shortness of breath. You are dizzy or light-headed and feel the need to lie down. You have pain or numbness in the arm or leg closest to your puncture site. Get help right away if: Your puncture site suddenly swells. Your puncture site is bleeding and the bleeding does not stop after applying firm pressure to the area. These symptoms may represent a serious problem that is an emergency. Do not wait to see if the symptoms will go away. Get medical help right away. Call your local emergency services (911 in the [place] Do not drive yourself to the hospital. Summary After the procedure, it is normal to have bruising and tenderness at the puncture site in your groin, neck, or forearm. Check your puncture site every day for signs of infection. Get help right away if your puncture site is bleeding and the bleeding does not stop after applying firm pressure to the area. This is a medical emergency. This information is not intended to replace advice given to you by your health care provider. Make sure you discuss any questions you have with your health care provider.

## 2021-12-19 NOTE — Anesthesia Procedure Notes (Signed)
Procedure Name: Intubation Date/Time: 12/19/2021 11:54 AM Performed by: Gaylene Brooks, CRNA Pre-anesthesia Checklist: Patient identified, Emergency Drugs available, Suction available and Patient being monitored Patient Re-evaluated:Patient Re-evaluated prior to induction Oxygen Delivery Method: Circle System Utilized Preoxygenation: Pre-oxygenation with 100% oxygen Induction Type: IV induction Ventilation: Mask ventilation without difficulty Laryngoscope Size: Glidescope and 3 Grade View: Grade II Tube type: Oral Tube size: 7.5 mm Number of attempts: 1 Airway Equipment and Method: Stylet, Oral airway and Video-laryngoscopy Placement Confirmation: ETT inserted through vocal cords under direct vision, positive ETCO2 and breath sounds checked- equal and bilateral Secured at: 23 cm Tube secured with: Tape Dental Injury: Teeth and Oropharynx as per pre-operative assessment  Comments: DL with Sabra Heck 2 by CRNA. Unable to completely lift epiglottis due to its excessive length. No good view obtained. Go Pro used next and good view obtained. AOI. +ETCO2, BBS=.

## 2021-12-20 ENCOUNTER — Encounter (HOSPITAL_COMMUNITY): Payer: Self-pay | Admitting: Cardiology

## 2021-12-20 MED FILL — Heparin Sod (Porcine)-NaCl IV Soln 1000 Unit/500ML-0.9%: INTRAVENOUS | Qty: 500 | Status: AC

## 2021-12-20 NOTE — Anesthesia Postprocedure Evaluation (Signed)
Anesthesia Post Note  Patient: Brandon [name]  Procedure(s) Performed: ATRIAL FIBRILLATION ABLATION     Patient location during evaluation: PACU Anesthesia Type: General Level of consciousness: awake and alert Pain management: pain level controlled Vital Signs Assessment: post-procedure vital signs reviewed and stable Respiratory status: spontaneous breathing, nonlabored ventilation and respiratory function stable Cardiovascular status: stable and blood pressure returned to baseline Anesthetic complications: no   No notable events documented.  Last Vitals:  Vitals:   12/19/21 1915 12/19/21 1925  BP:    Pulse: 62 63  Resp: 17 14  Temp:    SpO2: 97% 98%    Last Pain:  Vitals:   12/19/21 1456  TempSrc:   PainSc: 0-No pain                 Audry Pili

## 2021-12-30 ENCOUNTER — Telehealth (HOSPITAL_COMMUNITY): Payer: Self-pay | Admitting: *Deleted

## 2021-12-30 MED ORDER — METOPROLOL SUCCINATE ER 25 MG PO TB24: 25.0000 mg | ORAL_TABLET | Freq: Two times a day (BID) | ORAL | 3 refills | Status: DC

## 2021-12-30 NOTE — Telephone Encounter (Signed)
Patient called in stating starting Friday he is having increased episodes of AF. HRs in the 120s and lasting several hours. Discussed with Adline Peals PA will increase metoprolol to 25mg  BID and he will call if breakthrough continues. Educated on post-ablation expectations related to breakthrough. Pt in agreement.

## 2022-01-02 ENCOUNTER — Encounter (HOSPITAL_COMMUNITY): Payer: Self-pay | Admitting: Physician Assistant

## 2022-01-02 ENCOUNTER — Other Ambulatory Visit: Payer: Self-pay

## 2022-01-02 ENCOUNTER — Ambulatory Visit (HOSPITAL_COMMUNITY)
Admission: RE | Admit: 2022-01-02 | Discharge: 2022-01-02 | Disposition: A | Discharge status: Home / Self Care | Payer: Medicare Other | Source: Ambulatory Visit | Attending: Physician Assistant | Admitting: Physician Assistant

## 2022-01-02 VITALS — BP 128/74 | HR 65 | Ht 72.0 in | Wt 202.0 lb

## 2022-01-02 DIAGNOSIS — I4892 Unspecified atrial flutter: Secondary | ICD-10-CM | POA: Diagnosis not present

## 2022-01-02 DIAGNOSIS — E785 Hyperlipidemia, unspecified: Secondary | ICD-10-CM | POA: Diagnosis not present

## 2022-01-02 DIAGNOSIS — Z7901 Long term (current) use of anticoagulants: Secondary | ICD-10-CM | POA: Insufficient documentation

## 2022-01-02 DIAGNOSIS — I480 Paroxysmal atrial fibrillation: Secondary | ICD-10-CM | POA: Diagnosis not present

## 2022-01-02 MED ORDER — DILTIAZEM HCL 30 MG PO TABS: ORAL_TABLET | ORAL | 1 refills | Status: DC

## 2022-01-02 NOTE — Progress Notes (Signed)
Primary Care Physician: Alroy Dust, L.Marlou Sa, MD Primary Cardiologist: Dr Phineas Inches Primary Electrophysiologist: Dr Quentin Ore Referring Physician: [name]   Brandon [name] is a 71 y.o. male with a history of HLD and atrial fibrillation who presents for follow up in the [facility].  The patient was initially diagnosed with atrial fibrillation 05/2016 when he was admitted for afib with RVR, likely related to alcohol use. He converted with IV diltiazem and was discharged with PRN BB. Patient has a CHADS2VASC score of 1. He has done well for years but more recently has had more frequent episodes of afib. He was seen at the ED in [place] and underwent DCCV. He presented to the ED 09/08/21 after taking two doses of PRN metoprolol without resolution of his tachypalpitations. He converted spontaneously while being evaluated at the ED.   On follow up today, patient is s/p afib ablation with Dr Quentin Ore on 12/19/21. He states that on 12/27/21 noted heart racing on his fitbit. He checked his Jodelle Red which showed episodes of atrial flutter and atrial fibrillation. He continued to have intermittent episodes and yesterday he was in afib the entire day. This morning he is back in SR. He denies CP, swallowing pain, or groin issues.   Today, he denies symptoms of palpitations, chest pain, shortness of breath, orthopnea, PND, lower extremity edema, dizziness, presyncope, syncope, snoring, daytime somnolence, bleeding, or neurologic sequela. The patient is tolerating medications without difficulties and is otherwise without complaint today.    Atrial Fibrillation Risk Factors:  he does not have symptoms or diagnosis of sleep apnea. he does not have a history of rheumatic fever.   he has a BMI of Body mass index is 27.4 kg/m.Marland Kitchen Filed Weights   01/02/22 0854  Weight: 91.6 kg     Family History  Problem Relation Age of Onset   Diabetes Father    Heart attack Brother     Sudden death Brother    Hyperlipidemia Neg Hx    Hypertension Neg Hx    Colon cancer Neg Hx    Colon polyps Neg Hx    Esophageal cancer Neg Hx    Rectal cancer Neg Hx    Stomach cancer Neg Hx      Atrial Fibrillation Management history:  Previous antiarrhythmic drugs: none Previous cardioversions: 03/2021 Previous ablations: 12/19/21 CHADS2VASC score: 1 Anticoagulation history: Eliquis   Past Medical History:  Diagnosis Date   Allergy    pollen this yr 2020    Diverticulosis    Hx of adenomatous colonic polyps    Hx of atrial fibrillation    Hyperlipidemia    Irregular heart beats    "for a while"   Past Surgical History:  Procedure Laterality Date   APPENDECTOMY  1968   ATRIAL FIBRILLATION ABLATION N/A 12/19/2021   Procedure: ATRIAL FIBRILLATION ABLATION;  Surgeon: Vickie Epley, MD;  Location: [facility];  Service: Cardiovascular;  Laterality: N/A;   COLONOSCOPY     EXTERNAL EAR SURGERY  1997, 2000   HEMORRHOID SURGERY  1987   POLYPECTOMY      Current Outpatient Medications  Medication Sig Dispense Refill   apixaban (ELIQUIS) 5 MG TABS tablet Take 1 tablet (5 mg total) by mouth 2 (two) times daily. 60 tablet 11   atorvastatin (LIPITOR) 40 MG tablet Take 40 mg by mouth daily.     diltiazem (CARDIZEM) 30 MG tablet Take 1 tablet every 4 hours AS NEEDED for heart rate >  100 30 tablet 1   metoprolol succinate (TOPROL XL) 25 MG 24 hr tablet Take 1 tablet (25 mg total) by mouth 2 (two) times daily. 90 tablet 3   Multiple Vitamin (THERA) TABS Take 2 tablets by mouth daily.     OMEGA 3 1000 MG CAPS Take 2,000 mg by mouth daily.      Red Yeast Rice Extract (RED YEAST RICE PO) Take 1 tablet by mouth daily.     sildenafil (REVATIO) 20 MG tablet Take 60 mg by mouth daily as needed (ED).     TART CHERRY PO Take 1 tablet by mouth daily.     No current facility-administered medications for this encounter.    Allergies  Allergen Reactions   Orange Concentrate  [Flavoring Agent] Shortness Of Breath and Swelling    Orange flavor dye    Social History   Socioeconomic History   Marital status: Single    Spouse name: Not on file   Number of children: Not on file   Years of education: Not on file   Highest education level: Not on file  Occupational History   Not on file  Tobacco Use   Smoking status: Former    Packs/day: 0.25    Types: Cigarettes    Quit date: 05/01/2018    Years since quitting: 3.6   Smokeless tobacco: Current    Types: Snuff   Tobacco comments:    Nicotine only patches 01/02/22  Substance and Sexual Activity   Alcohol use: Yes    Alcohol/week: 20.0 - 25.0 standard drinks    Types: 20 - 25 Standard drinks or equivalent per week    Comment: 4-5 mixed daily 5 days week 01/02/22   Drug use: No   Sexual activity: Not on file  Other Topics Concern   Not on file  Social History Narrative   Not on file   Social Determinants of Health   Financial Resource Strain: Not on file  Food Insecurity: Not on file  Transportation Needs: Not on file  Physical Activity: Not on file  Stress: Not on file  Social Connections: Not on file  Intimate Partner Violence: Not on file     ROS- All systems are reviewed and negative except as per the HPI above.  Physical Exam: Vitals:   01/02/22 0854  BP: 128/74  Pulse: 65  Weight: 91.6 kg  Height: 6' (1.829 m)    GEN- The patient is a well appearing male, alert and oriented x 3 today.   HEENT-head normocephalic, atraumatic, sclera clear, conjunctiva pink, hearing intact, trachea midline. Lungs- Clear to ausculation bilaterally, normal work of breathing Heart- Regular rate and rhythm, no murmurs, rubs or gallops  GI- soft, NT, ND, + BS Extremities- no clubbing, cyanosis, or edema MS- no significant deformity or atrophy Skin- no rash or lesion Psych- euthymic mood, full affect Neuro- strength and sensation are intact   Wt Readings from Last 3 Encounters:  01/02/22 91.6 kg   12/19/21 87.1 kg  10/21/21 89.4 kg    EKG today demonstrates  SR Vent. rate 65 BPM PR interval 148 ms QRS duration 100 ms QT/QTcB 404/420 ms  Echo 09/16/21 demonstrated  1. Left ventricular ejection fraction, by estimation, is 55 to 60%. The  left ventricle has normal function. The left ventricle has no regional  wall motion abnormalities. Left ventricular diastolic parameters were  normal. The average left ventricular global longitudinal strain is -22.2 %. The global longitudinal strain is normal.   2. Right  ventricular systolic function is normal. The right ventricular  size is normal. There is normal pulmonary artery systolic pressure.   3. The mitral valve is normal in structure. Trivial mitral valve  regurgitation. No evidence of mitral stenosis.   4. Tricuspid valve regurgitation is mild to moderate.   5. The aortic valve is normal in structure. Aortic valve regurgitation is  not visualized. Mild aortic valve sclerosis is present, with no evidence  of aortic valve stenosis.   6. The inferior vena cava is normal in size with greater than 50%  respiratory variability, suggesting right atrial pressure of 3 mmHg.   Epic records are reviewed at length today  CHA2DS2-VASc Score = 1  The patient's score is based upon: CHF History: 0 HTN History: 0 Diabetes History: 0 Stroke History: 0 Vascular Disease History: 0 Age Score: 1 Gender Score: 0       ASSESSMENT AND PLAN: 1. Paroxysmal Atrial Fibrillation/atrial flutter The patient's CHA2DS2-VASc score is 1, indicating a 0.6% annual risk of stroke.   Suspect recent episodes are due to recent ablation.  Will start diltiazem 30 mg PRN q 4 hours for heart racing. If episodes are very frequent, could consider short term flecainide.  Continue Toprol 25 mg BID Continue Eliquis 5 mg BID with no missed doses for 3 months post ablation.    Follow up in the AF clinic as scheduled.    [facility] [address] [place], [place] [address] [phone] 01/02/2022 9:48 AM

## 2022-01-02 NOTE — Patient Instructions (Signed)
Cardizem 30mg -- take 1 tablet every 4 hours AS NEEDED for heart rate >100 as long as top number of blood pressure >100.  

## 2022-01-16 ENCOUNTER — Encounter (HOSPITAL_COMMUNITY): Payer: Self-pay | Admitting: Physician Assistant

## 2022-01-16 ENCOUNTER — Ambulatory Visit (HOSPITAL_COMMUNITY)
Admit: 2022-01-16 | Discharge: 2022-01-16 | Disposition: A | Discharge status: Home / Self Care | Payer: Medicare Other | Attending: Physician Assistant | Admitting: Physician Assistant

## 2022-01-16 ENCOUNTER — Other Ambulatory Visit: Payer: Self-pay

## 2022-01-16 VITALS — BP 112/74 | HR 62 | Ht 72.0 in | Wt 201.4 lb

## 2022-01-16 DIAGNOSIS — I4892 Unspecified atrial flutter: Secondary | ICD-10-CM | POA: Diagnosis not present

## 2022-01-16 DIAGNOSIS — E785 Hyperlipidemia, unspecified: Secondary | ICD-10-CM | POA: Insufficient documentation

## 2022-01-16 DIAGNOSIS — Z7901 Long term (current) use of anticoagulants: Secondary | ICD-10-CM | POA: Insufficient documentation

## 2022-01-16 DIAGNOSIS — I2510 Atherosclerotic heart disease of native coronary artery without angina pectoris: Secondary | ICD-10-CM | POA: Diagnosis not present

## 2022-01-16 DIAGNOSIS — I480 Paroxysmal atrial fibrillation: Secondary | ICD-10-CM

## 2022-01-16 DIAGNOSIS — Z79899 Other long term (current) drug therapy: Secondary | ICD-10-CM | POA: Insufficient documentation

## 2022-01-16 NOTE — Progress Notes (Signed)
Primary Care Physician: Alroy Dust, L.Marlou Sa, MD Primary Cardiologist: Dr Phineas Inches Primary Electrophysiologist: Dr Quentin Ore Referring Physician: [name]   Brandon [name] is a 71 y.o. male with a history of HLD and atrial fibrillation who presents for follow up in the [facility].  The patient was initially diagnosed with atrial fibrillation 05/2016 when he was admitted for afib with RVR, likely related to alcohol use. He converted with IV diltiazem and was discharged with PRN BB. Patient has a CHADS2VASC score of 1. He has done well for years but more recently has had more frequent episodes of afib. He was seen at the ED in [place] and underwent DCCV. He presented to the ED 09/08/21 after taking two doses of PRN metoprolol without resolution of his tachypalpitations. He converted spontaneously while being evaluated at the ED.   Patient is s/p afib ablation with Dr Quentin Ore on 12/19/21. He states that on 12/27/21 noted heart racing on his fitbit. He checked his Jodelle Red which showed episodes of atrial flutter and atrial fibrillation.   On follow up today, patient reports that he has had no interim episodes of afib. He denies any CP, swallowing pain, or groin issues. He has not had to use any PRN CCB.  Today, he denies symptoms of palpitations, chest pain, shortness of breath, orthopnea, PND, lower extremity edema, dizziness, presyncope, syncope, snoring, daytime somnolence, bleeding, or neurologic sequela. The patient is tolerating medications without difficulties and is otherwise without complaint today.    Atrial Fibrillation Risk Factors:  he does not have symptoms or diagnosis of sleep apnea. he does not have a history of rheumatic fever.   he has a BMI of Body mass index is 27.31 kg/m.Marland Kitchen Filed Weights   01/16/22 1401  Weight: 91.4 kg      Family History  Problem Relation Age of Onset   Diabetes Father    Heart attack Brother    Sudden death  Brother    Hyperlipidemia Neg Hx    Hypertension Neg Hx    Colon cancer Neg Hx    Colon polyps Neg Hx    Esophageal cancer Neg Hx    Rectal cancer Neg Hx    Stomach cancer Neg Hx      Atrial Fibrillation Management history:  Previous antiarrhythmic drugs: none Previous cardioversions: 03/2021 Previous ablations: 12/19/21 CHADS2VASC score: 1 Anticoagulation history: Eliquis   Past Medical History:  Diagnosis Date   Allergy    pollen this yr 2020    Diverticulosis    Hx of adenomatous colonic polyps    Hx of atrial fibrillation    Hyperlipidemia    Irregular heart beats    "for a while"   Past Surgical History:  Procedure Laterality Date   APPENDECTOMY  1968   ATRIAL FIBRILLATION ABLATION N/A 12/19/2021   Procedure: ATRIAL FIBRILLATION ABLATION;  Surgeon: Vickie Epley, MD;  Location: [facility];  Service: Cardiovascular;  Laterality: N/A;   COLONOSCOPY     EXTERNAL EAR SURGERY  1997, 2000   HEMORRHOID SURGERY  1987   POLYPECTOMY      Current Outpatient Medications  Medication Sig Dispense Refill   apixaban (ELIQUIS) 5 MG TABS tablet Take 1 tablet (5 mg total) by mouth 2 (two) times daily. 60 tablet 11   atorvastatin (LIPITOR) 40 MG tablet Take 40 mg by mouth daily.     diltiazem (CARDIZEM) 30 MG tablet Take 1 tablet every 4 hours AS NEEDED for heart  rate >100 30 tablet 1   metoprolol succinate (TOPROL XL) 25 MG 24 hr tablet Take 1 tablet (25 mg total) by mouth 2 (two) times daily. 90 tablet 3   Multiple Vitamin (THERA) TABS Take 2 tablets by mouth daily.     OMEGA 3 1000 MG CAPS Take 2,000 mg by mouth daily.      Red Yeast Rice Extract (RED YEAST RICE PO) Take 1 tablet by mouth daily.     sildenafil (REVATIO) 20 MG tablet Take 60 mg by mouth daily as needed (ED).     TART CHERRY PO Take 1 tablet by mouth daily.     No current facility-administered medications for this encounter.    Allergies  Allergen Reactions   Orange Concentrate [Flavoring Agent]  Shortness Of Breath and Swelling    Orange flavor dye    Social History   Socioeconomic History   Marital status: Single    Spouse name: Not on file   Number of children: Not on file   Years of education: Not on file   Highest education level: Not on file  Occupational History   Not on file  Tobacco Use   Smoking status: Former    Packs/day: 0.25    Types: Cigarettes    Quit date: 05/01/2018    Years since quitting: 3.7   Smokeless tobacco: Current    Types: Snuff   Tobacco comments:    Nicotine only patches 01/02/22  Substance and Sexual Activity   Alcohol use: Yes    Alcohol/week: 20.0 - 25.0 standard drinks    Types: 20 - 25 Standard drinks or equivalent per week    Comment: 4-5 mixed daily 5 days week 01/02/22   Drug use: No   Sexual activity: Not on file  Other Topics Concern   Not on file  Social History Narrative   Not on file   Social Determinants of Health   Financial Resource Strain: Not on file  Food Insecurity: Not on file  Transportation Needs: Not on file  Physical Activity: Not on file  Stress: Not on file  Social Connections: Not on file  Intimate Partner Violence: Not on file     ROS- All systems are reviewed and negative except as per the HPI above.  Physical Exam: Vitals:   01/16/22 1401  BP: 112/74  Pulse: 62  Weight: 91.4 kg  Height: 6' (1.829 m)    GEN- The patient is a well appearing male, alert and oriented x 3 today.   HEENT-head normocephalic, atraumatic, sclera clear, conjunctiva pink, hearing intact, trachea midline. Lungs- Clear to ausculation bilaterally, normal work of breathing Heart- Regular rate and rhythm, no murmurs, rubs or gallops  GI- soft, NT, ND, + BS Extremities- no clubbing, cyanosis, or edema MS- no significant deformity or atrophy Skin- no rash or lesion Psych- euthymic mood, full affect Neuro- strength and sensation are intact   Wt Readings from Last 3 Encounters:  01/16/22 91.4 kg  01/02/22 91.6 kg   12/19/21 87.1 kg    EKG today demonstrates  SR Vent. rate 62 BPM PR interval 154 ms QRS duration 98 ms QT/QTcB 406/412 ms  Echo 09/16/21 demonstrated  1. Left ventricular ejection fraction, by estimation, is 55 to 60%. The  left ventricle has normal function. The left ventricle has no regional  wall motion abnormalities. Left ventricular diastolic parameters were  normal. The average left ventricular global longitudinal strain is -22.2 %. The global longitudinal strain is normal.   2.  Right ventricular systolic function is normal. The right ventricular  size is normal. There is normal pulmonary artery systolic pressure.   3. The mitral valve is normal in structure. Trivial mitral valve  regurgitation. No evidence of mitral stenosis.   4. Tricuspid valve regurgitation is mild to moderate.   5. The aortic valve is normal in structure. Aortic valve regurgitation is  not visualized. Mild aortic valve sclerosis is present, with no evidence  of aortic valve stenosis.   6. The inferior vena cava is normal in size with greater than 50%  respiratory variability, suggesting right atrial pressure of 3 mmHg.   Epic records are reviewed at length today  CHA2DS2-VASc Score = 2  The patient's score is based upon: CHF History: 0 HTN History: 0 Diabetes History: 0 Stroke History: 0 Vascular Disease History: 1 Age Score: 1 Gender Score: 0       ASSESSMENT AND PLAN: 1. Paroxysmal Atrial Fibrillation/atrial flutter The patient's CHA2DS2-VASc score is 2, indicating a 2.2% annual risk of stroke.   Patient appears to be maintaining SR. Continue diltiazem 30 mg PRN q 4 hours for heart racing. Continue Toprol 25 mg BID Continue Eliquis 5 mg BID with no missed doses for 3 months post ablation.   2. CAD CAC score 249, 64th percentile No anginal symptoms. On statin   Follow up with Dr Harl Bowie and Dr Quentin Ore as scheduled.    [facility] [address] [place], [place] [address] [phone] 01/16/2022 2:20 PM

## 2022-01-23 ENCOUNTER — Telehealth: Payer: Self-pay | Admitting: Internal Medicine

## 2022-01-23 NOTE — Telephone Encounter (Signed)
Pt has a question about upcoming appointments... please advise

## 2022-01-23 NOTE — Telephone Encounter (Signed)
Returned call to patient, advised him to keep follow up with EP, and cancelled appointment on 2/28 with Dr. Harl Bowie.   Advised patient to call back to office with any issues, questions, or concerns. Patient verbalized understanding.   Janina Mayo, MD  You; Kathreen Devoid, RN 5 hours ago (12:02 PM)   MB Please let him know he can discuss with EP on his next visit.  He was with them/Afib clinic before I saw him. He can also cancel follow-up with me as well. His main issue is atrial fibrillation and he had an ablation with EP

## 2022-01-23 NOTE — Telephone Encounter (Signed)
Patient had a question about appointments with a fib clinic. He would like to limit how many appointments he has between cardiologist and a fib clinic. I recommended that he speak with Dr. Harl Bowie at his upcoming appointment with her. He said he would.

## 2022-01-28 ENCOUNTER — Ambulatory Visit: Payer: BLUE CROSS/BLUE SHIELD | Admitting: Internal Medicine

## 2022-01-28 ENCOUNTER — Other Ambulatory Visit (HOSPITAL_COMMUNITY): Payer: Self-pay | Admitting: *Deleted

## 2022-01-28 MED ORDER — METOPROLOL SUCCINATE ER 25 MG PO TB24
25.0000 mg | ORAL_TABLET | Freq: Two times a day (BID) | ORAL | 1 refills | Status: DC
Start: 2022-01-28 — End: 2022-03-21

## 2022-03-21 ENCOUNTER — Encounter: Payer: Self-pay | Admitting: Cardiology

## 2022-03-21 ENCOUNTER — Ambulatory Visit (INDEPENDENT_AMBULATORY_CARE_PROVIDER_SITE_OTHER): Payer: Medicare Other | Admitting: Cardiology

## 2022-03-21 VITALS — BP 130/84 | HR 58 | Ht 72.0 in | Wt 197.8 lb

## 2022-03-21 DIAGNOSIS — I480 Paroxysmal atrial fibrillation: Secondary | ICD-10-CM

## 2022-03-21 MED ORDER — METOPROLOL SUCCINATE ER 25 MG PO TB24: 25.0000 mg | ORAL_TABLET | Freq: Every day | ORAL | 3 refills | Status: DC

## 2022-03-21 NOTE — Patient Instructions (Addendum)
Medication Instructions:  ?Stop Eliquis on July 19 ?Your physician recommends that you continue on your current medications as directed. Please refer to the Current Medication list given to you today. ?*If you need a refill on your cardiac medications before your next appointment, please call your pharmacy* ? ?Lab Work: ?None. ?If you have labs (blood work) drawn today and your tests are completely normal, you will receive your results only by: ?MyChart Message (if you have MyChart) OR ?A paper copy in the mail ?If you have any lab test that is abnormal or we need to change your treatment, we will call you to review the results. ? ?Testing/Procedures: ?None. ? ?Follow-Up: ?At [facility] ([name]), you and your health needs are our priority.  As part of our continuing mission to provide you with exceptional heart care, we have created designated Provider Care Teams.  These Care Teams include your primary Cardiologist (physician) and Advanced Practice Providers (APPs -  Physician Assistants and Nurse Practitioners) who all work together to provide you with the care you need, when you need it. ? ?Your physician wants you to follow-up in: next available for loop implant with Dr. Quentin Ore in the office.  ? ?We recommend signing up for the patient portal called "MyChart".  Sign up information is provided on this After Visit Summary.  MyChart is used to connect with patients for Virtual Visits (Telemedicine).  Patients are able to view lab/test results, encounter notes, upcoming appointments, etc.  Non-urgent messages can be sent to your provider as well.   ?To learn more about what you can do with MyChart, go to [email or web address].   ? ?Any Other Special Instructions Will Be Listed Below (If Applicable). ? ?Implantable Loop Recorder Placement ? ?An implantable loop recorder is a small electronic device that is placed under the skin of your chest. The device records the electrical activity of your heart over a long period of  time. Your health care provider can download these recordings to monitor your heart. ?You may need an implantable loop recorder if you have periods of abnormal heart activity (arrhythmias) or unexplained fainting (syncope). The recorder can be left in place for 1 year or longer. ?Tell a health care provider about: ?Any allergies you have. ?All medicines you are taking, including vitamins, herbs, eye drops, creams, and over-the-counter medicines. ?Any problems you or family members have had with anesthetic medicines. ?Any bleeding problems you have. ?Any surgeries you have had. ?Any medical conditions you have. ?Whether you are pregnant or may be pregnant. ?What are the risks? ?Generally, this is a safe procedure. However, problems may occur, including: ?Infection. ?Bleeding. ?Allergic reactions to anesthetic medicines. ?Damage to nerves or blood vessels. ?Failure of the device to work. This could require another surgery to replace it. ?What happens before the procedure? ? ?You may have a physical exam, blood tests, and imaging tests of your heart, such as a chest X-ray. ?Follow instructions from your health care provider about eating or drinking restrictions. ?Ask your health care provider about: ?Changing or stopping your regular medicines. This is especially important if you are taking diabetes medicines or blood thinners. ?Taking medicines such as aspirin and ibuprofen. These medicines can thin your blood. Do not take these medicines unless your health care provider tells you to take them. ?Taking over-the-counter medicines, vitamins, herbs, and supplements. ?Ask your health care provider how your surgical site will be marked or identified. ?Ask your health care provider what steps will be taken to help prevent infection. These  may include: ?Removing hair at the surgery site. ?Washing skin with a germ-killing soap. ?Plan to have someone take you home from the hospital or clinic. ?Plan to have a responsible adult  care for you for at least 24 hours after you leave the hospital or clinic. This is important. ?Do not use any products that contain nicotine or tobacco, such as cigarettes and e-cigarettes. If you need help quitting, ask your health care provider. ?What happens during the procedure? ?An IV will be inserted into one of your veins. ?You may be given one or more of the following: ?A medicine to help you relax (sedative). ?A medicine to numb the area (local anesthetic). ?A small incision will be made on the left side of your upper chest. ?A pocket will be created under your skin. ?The device will be placed in the pocket. ?The incision will be closed with stitches (sutures) or adhesive strips. ?A bandage (dressing) will be placed over the incision. ?The procedure may vary among health care providers and hospitals. ?What happens after the procedure? ?Your blood pressure, heart rate, breathing rate, and blood oxygen level will be monitored until you leave the hospital or clinic. ?You may be able to go home on the day of your surgery. Before you go home: ?Your health care provider will program your recorder. ?You will learn how to trigger your device with a handheld activator. ?You will learn how to send recordings to your health care provider. ?You will get an ID card for your device, and you will be told when to use it. ?Do not drive for 24 hours if you were given a sedative during your procedure. ?Summary ?An implantable loop recorder is a small electronic device that is placed under the skin of your chest to monitor your heart over a long period of time. ?The recorder can be left in place for 1 year or longer. ?Plan to have someone take you home from the hospital or clinic. ?This information is not intended to replace advice given to you by your health care provider. Make sure you discuss any questions you have with your health care provider. ?Document Revised: 03/19/2021 Document Reviewed: 03/19/2021 ?Elsevier Patient  Education ? [place]. ? ? ? ?  ? ? ?

## 2022-03-21 NOTE — Progress Notes (Signed)
?Electrophysiology Office Follow up Visit Note:   ? ?Date:  03/22/2022  ? ?Brandon [name], DOB Feb 24, 1951, MRN [id number] ? ?PCP:  Alroy Dust, L.Marlou Sa, MD  ?[facility] Cardiologist:  None  ?[place] Electrophysiologist:  Vickie Epley, MD  ? ? ?Interval History:   ? ?Brandon [name] is a 71 y.o. male who presents for a follow up visit.  He underwent a successful A-fib ablation on December 19, 2021.  During the ablation the pulmonary veins were isolated.  He saw Audry Pili in follow-up on January 16, 2022.  At that appointment he reported no recurrence of atrial fibrillation and was feeling well.  He continues to take Eliquis for stroke prophylaxis. ? ?He is planning to go back to [place] for 4 months.  He tells me that he had palpitations for 2 weeks following the ablation but since the 2-week mark he is not had a recurrence of his arrhythmia.  This is a marked improvement from prior to the ablation. ? ?  ? ?Past Medical History:  ?Diagnosis Date  ? Allergy   ? pollen this yr 2020   ? Diverticulosis   ? Hx of adenomatous colonic polyps   ? Hx of atrial fibrillation   ? Hyperlipidemia   ? Irregular heart beats   ? "for a while"  ? ? ?Past Surgical History:  ?Procedure Laterality Date  ? APPENDECTOMY  1968  ? ATRIAL FIBRILLATION ABLATION N/A 12/19/2021  ? Procedure: ATRIAL FIBRILLATION ABLATION;  Surgeon: Vickie Epley, MD;  Location: [facility];  Service: Cardiovascular;  Laterality: N/A;  ? COLONOSCOPY    ? EXTERNAL EAR SURGERY  1997, 2000  ? [place]  ? POLYPECTOMY    ? ? ?Current Medications: ?Current Meds  ?Medication Sig  ? apixaban (ELIQUIS) 5 MG TABS tablet Take 1 tablet (5 mg total) by mouth 2 (two) times daily.  ? atorvastatin (LIPITOR) 40 MG tablet Take 40 mg by mouth daily.  ? diltiazem (CARDIZEM) 30 MG tablet Take 1 tablet every 4 hours AS NEEDED for heart rate >100  ? metoprolol succinate (TOPROL XL) 25 MG 24 hr tablet Take 1 tablet (25 mg total) by mouth at bedtime.  ?  Multiple Vitamin (THERA) TABS Take 2 tablets by mouth daily.  ? OMEGA 3 1000 MG CAPS Take 2,000 mg by mouth daily.   ? Red Yeast Rice Extract (RED YEAST RICE PO) Take 1 tablet by mouth daily.  ? sildenafil (REVATIO) 20 MG tablet Take 60 mg by mouth daily as needed (ED).  ? TART CHERRY PO Take 1 tablet by mouth daily.  ? [DISCONTINUED] metoprolol succinate (TOPROL XL) 25 MG 24 hr tablet Take 1 tablet (25 mg total) by mouth 2 (two) times daily.  ?  ? ?Allergies:   Orange concentrate [flavoring agent]  ? ?Social History  ? ?Socioeconomic History  ? Marital status: Single  ?  Spouse name: Not on file  ? Number of children: Not on file  ? Years of education: Not on file  ? Highest education level: Not on file  ?Occupational History  ? Not on file  ?Tobacco Use  ? Smoking status: Former  ?  Packs/day: 0.25  ?  Types: Cigarettes  ?  Quit date: 05/01/2018  ?  Years since quitting: 3.8  ? Smokeless tobacco: Current  ?  Types: Snuff  ? Tobacco comments:  ?  Nicotine only patches 01/02/22  ?Substance and Sexual Activity  ? Alcohol use: Yes  ?  Alcohol/week: 20.0 - 25.0 standard drinks  ?  Types: 20 - 25 Standard drinks or equivalent per week  ?  Comment: 4-5 mixed daily 5 days week 01/02/22  ? Drug use: No  ? Sexual activity: Not on file  ?Other Topics Concern  ? Not on file  ?Social History Narrative  ? Not on file  ? ?Social Determinants of Health  ? ?Financial Resource Strain: Not on file  ?Food Insecurity: Not on file  ?Transportation Needs: Not on file  ?Physical Activity: Not on file  ?Stress: Not on file  ?Social Connections: Not on file  ?  ? ?Family History: ?The patient's family history includes Diabetes in his father; Heart attack in his brother; Sudden death in his brother. There is no history of Hyperlipidemia, Hypertension, Colon cancer, Colon polyps, Esophageal cancer, Rectal cancer, or Stomach cancer. ? ?ROS:   ?Please see the history of present illness.    ?All other systems reviewed and are  negative. ? ?EKGs/Labs/Other Studies Reviewed:   ? ?The following studies were reviewed today: ? ? ?EKG:  The ekg ordered today demonstrates sinus rhythm. ? ?Recent Labs: ?09/08/2021: ALT 33; Magnesium 2.1 ?12/03/2021: BUN 13; Creatinine, Ser 0.91; Hemoglobin 14.3; Platelets 152; Potassium 4.7; Sodium 140  ?Recent Lipid Panel ?No results found for: CHOL, TRIG, HDL, CHOLHDL, VLDL, LDLCALC, LDLDIRECT ? ?Physical Exam:   ? ?VS:  BP 130/84   Pulse (!) 58   Ht 6' (1.829 m)   Wt 197 lb 12.8 oz (89.7 kg)   SpO2 96%   BMI 26.83 kg/m?    ? ?Wt Readings from Last 3 Encounters:  ?03/21/22 197 lb 12.8 oz (89.7 kg)  ?01/16/22 201 lb 6.4 oz (91.4 kg)  ?01/02/22 202 lb (91.6 kg)  ?  ? ?GEN:  Well nourished, well developed in no acute distress ?HEENT: Normal ?NECK: No JVD; No carotid bruits ?LYMPHATICS: No lymphadenopathy ?CARDIAC: RRR, no murmurs, rubs, gallops ?RESPIRATORY:  Clear to auscultation without rales, wheezing or rhonchi  ?ABDOMEN: Soft, non-tender, non-distended ?MUSCULOSKELETAL:  No edema; No deformity  ?SKIN: Warm and dry ?NEUROLOGIC:  Alert and oriented x 3 ?PSYCHIATRIC:  Normal affect  ? ? ? ?  ? ?ASSESSMENT:   ? ?1. Paroxysmal atrial fibrillation (HCC)   ? ?PLAN:   ? ?In order of problems listed above: ? ?#Paroxysmal atrial fibrillation ?Doing well after ablation in January 2023.  Continue Eliquis uninterrupted for 6 months post ablation.  I recommended he pursue some form of heart rhythm monitoring.  We discussed Apple watch, Galaxy watch and loop recorder monitoring. ?He would like to proceed with loop recorder implant.  I did discuss the loop recorder implant procedure in detail with the patient and discussed the need for monitoring and its associated costs.  He wishes to proceed with scheduling. ?We will plan to get the loop recorder implanted at the next available appointment date.  If no recurrence of arrhythmia, he is okay to stop his anticoagulant on July 19.  I discussed the stroke risk of stopping  anticoagulation in detail during today's visit and he is understanding. ? ?He will also change his metoprolol succinate to 1 time a day in the evening. ? ? ?Total time spent with patient today 30 minutes. This includes reviewing records, evaluating the patient and coordinating care.  ? ?Medication Adjustments/Labs and Tests Ordered: ?Current medicines are reviewed at length with the patient today.  Concerns regarding medicines are outlined above.  ?Orders Placed This Encounter  ?Procedures  ? EKG 12-Lead  ? ?  Meds ordered this encounter  ?Medications  ? metoprolol succinate (TOPROL XL) 25 MG 24 hr tablet  ?  Sig: Take 1 tablet (25 mg total) by mouth at bedtime.  ?  Dispense:  90 tablet  ?  Refill:  3  ? ? ? ?Signed, ?Lars Mage, MD, [facility], [facility] ?03/22/2022 9:37 AM    ?Electrophysiology ?[place] ?

## 2022-03-30 NOTE — Progress Notes (Signed)
?Electrophysiology Office Follow up Visit Note:   ? ?Date:  04/02/2022  ? ?Brandon [name], DOB 1951-03-02, MRN [id number] ? ?PCP:  Alroy Dust, L.Marlou Sa, MD  ?[facility] Cardiologist:  None  ?[place] [facility] Electrophysiologist:  Vickie Epley, MD  ? ? ?Interval History:   ? ?Brandon [name] is a 71 y.o. male who presents for a follow up visit. They were last seen in clinic March 21, 2022 in follow-up for his atrial fibrillation.  The patient is post and successful A-fib ablation on December 19, 2021.  He has not had a recurrence of atrial fibrillation and has been feeling well.  He presents back to clinic today for loop recorder implant as part of a strategy of atrial fibrillation surveillance in an effort to avoid long-term exposure to anticoagulation.  He has an upcoming trip to [place] for several months.  No changes since I last saw him. ? ? ? ?  ? ?Past Medical History:  ?Diagnosis Date  ? Allergy   ? pollen this yr 2020   ? Diverticulosis   ? Hx of adenomatous colonic polyps   ? Hx of atrial fibrillation   ? Hyperlipidemia   ? Irregular heart beats   ? "for a while"  ? ? ?Past Surgical History:  ?Procedure Laterality Date  ? APPENDECTOMY  1968  ? ATRIAL FIBRILLATION ABLATION N/A 12/19/2021  ? Procedure: ATRIAL FIBRILLATION ABLATION;  Surgeon: Vickie Epley, MD;  Location: [facility];  Service: Cardiovascular;  Laterality: N/A;  ? COLONOSCOPY    ? EXTERNAL EAR SURGERY  1997, 2000  ? [place]  ? POLYPECTOMY    ? ? ?Current Medications: ?Current Meds  ?Medication Sig  ? apixaban (ELIQUIS) 5 MG TABS tablet Take 1 tablet (5 mg total) by mouth 2 (two) times daily.  ? atorvastatin (LIPITOR) 40 MG tablet Take 40 mg by mouth daily.  ? diltiazem (CARDIZEM) 30 MG tablet Take 1 tablet every 4 hours AS NEEDED for heart rate >100  ? metoprolol succinate (TOPROL XL) 25 MG 24 hr tablet Take 1 tablet (25 mg total) by mouth at bedtime.  ? Multiple Vitamin (THERA) TABS Take 2 tablets by mouth daily.   ? OMEGA 3 1000 MG CAPS Take 2,000 mg by mouth daily.   ? Red Yeast Rice Extract (RED YEAST RICE PO) Take 1 tablet by mouth daily.  ? sildenafil (REVATIO) 20 MG tablet Take 60 mg by mouth daily as needed (ED).  ? TART CHERRY PO Take 1 tablet by mouth daily.  ?  ? ?Allergies:   Orange concentrate [flavoring agent]  ? ?Social History  ? ?Socioeconomic History  ? Marital status: Single  ?  Spouse name: Not on file  ? Number of children: Not on file  ? Years of education: Not on file  ? Highest education level: Not on file  ?Occupational History  ? Not on file  ?Tobacco Use  ? Smoking status: Former  ?  Packs/day: 0.25  ?  Types: Cigarettes  ?  Quit date: 05/01/2018  ?  Years since quitting: 3.9  ? Smokeless tobacco: Current  ?  Types: Snuff  ? Tobacco comments:  ?  Nicotine only patches 01/02/22  ?Substance and Sexual Activity  ? Alcohol use: Yes  ?  Alcohol/week: 20.0 - 25.0 standard drinks  ?  Types: 20 - 25 Standard drinks or equivalent per week  ?  Comment: 4-5 mixed daily 5 days week 01/02/22  ? Drug  use: No  ? Sexual activity: Not on file  ?Other Topics Concern  ? Not on file  ?Social History Narrative  ? Not on file  ? ?Social Determinants of Health  ? ?Financial Resource Strain: Not on file  ?Food Insecurity: Not on file  ?Transportation Needs: Not on file  ?Physical Activity: Not on file  ?Stress: Not on file  ?Social Connections: Not on file  ?  ? ?Family History: ?The patient's family history includes Diabetes in his father; Heart attack in his brother; Sudden death in his brother. There is no history of Hyperlipidemia, Hypertension, Colon cancer, Colon polyps, Esophageal cancer, Rectal cancer, or Stomach cancer. ? ?ROS:   ?Please see the history of present illness.    ?All other systems reviewed and are negative. ? ?EKGs/Labs/Other Studies Reviewed:   ? ?The following studies were reviewed today: ? ? ?Recent Labs: ?09/08/2021: ALT 33; Magnesium 2.1 ?12/03/2021: BUN 13; Creatinine, Ser 0.91; Hemoglobin 14.3;  Platelets 152; Potassium 4.7; Sodium 140  ?Recent Lipid Panel ?No results found for: CHOL, TRIG, HDL, CHOLHDL, VLDL, LDLCALC, LDLDIRECT ? ?Physical Exam:   ? ?VS:  BP (!) 144/82   Pulse 61   Ht 6' (1.829 m)   Wt 198 lb 12.8 oz (90.2 kg)   SpO2 99%   BMI 26.96 kg/m?    ? ?Wt Readings from Last 3 Encounters:  ?04/02/22 198 lb 12.8 oz (90.2 kg)  ?03/21/22 197 lb 12.8 oz (89.7 kg)  ?01/16/22 201 lb 6.4 oz (91.4 kg)  ?  ? ?GEN:  Well nourished, well developed in no acute distress ?HEENT: Normal ?NECK: No JVD; No carotid bruits ?LYMPHATICS: No lymphadenopathy ?CARDIAC: RRR, no murmurs, rubs, gallops ?RESPIRATORY:  Clear to auscultation without rales, wheezing or rhonchi  ?ABDOMEN: Soft, non-tender, non-distended ?MUSCULOSKELETAL:  No edema; No deformity  ?SKIN: Warm and dry ?NEUROLOGIC:  Alert and oriented x 3 ?PSYCHIATRIC:  Normal affect  ? ? ? ?  ? ?ASSESSMENT:   ? ?1. Paroxysmal atrial fibrillation (HCC)   ?2. Palpitations   ? ?PLAN:   ? ?In order of problems listed above: ? ?#Paroxysmal atrial fibrillation ?#Palpitations ?Patient is doing well after his ablation.  Planning to implant loop recorder for ongoing surveillance of atrial fibrillation in an effort to avoid long-term exposure to anticoagulation.  He also has palpitations which will be better monitored with the loop recorder. I discussed the loop recorder implant procedure in detail with the patient including the risks and monthly monitoring costs and he wishes to proceed.  Still planning to have him stop his anticoagulation in July of this year if loop recorder monitoring does not show evidence of atrial fibrillation.  We will continue monthly monitoring of his heart rhythms and plan to restart anticoagulation if he were to have a recurrence down the road given his CHA2DS2-VASc. ? ?Follow-up 1 year with APP. ? ? ?Medication Adjustments/Labs and Tests Ordered: ?Current medicines are reviewed at length with the patient today.  Concerns regarding medicines  are outlined above.  ?No orders of the defined types were placed in this encounter. ? ?No orders of the defined types were placed in this encounter. ? ? ? ?Signed, ?Lars Mage, MD, [place] ?04/02/2022 10:18 AM    ?Electrophysiology ?[place] ? ?------------------------------------ ? ?SURGEON:  Lars Mage, MD ?   ?PREPROCEDURE DIAGNOSIS:  Atrial fibrillation, palpitations ?   ?POSTPROCEDURE DIAGNOSIS:  Atrial fibrillation, palpitations ?   ? PROCEDURES:  ? 1. Implantable loop recorder implantation ?   ?  INTRODUCTION:  Brandon [name] is a 71 y.o. male with a history of palpitations and atrial fibrillation who presents today for implantable loop implantation.  The monitoring costs associated with loop recorder monitoring have been discussed with the patient. ?   ?DESCRIPTION OF PROCEDURE:  Informed written consent was obtained.  The patient required no sedation for the procedure today.  Mapping over the patient's chest was performed to identify the area where electrograms were most prominent for ILR recording.  This area was found to be the left parasternal region over the 3rd-4th intercostal space. ?The patients left chest was therefore prepped and draped in the usual sterile fashion. The skin overlying the left parasternal region was infiltrated with lidocaine for local analgesia.  A 0.5-cm incision was made over the left parasternal region over the 3rd intercostal space.  A subcutaneous ILR pocket was fashioned using a combination of sharp and blunt dissection.  A Medtronic Reveal Linq model [id number]) implantable loop recorder was then placed into the pocket  R waves were very prominent and measured >0.6m.  Steri- Strips and a sterile dressing were then applied.  There were no early apparent complications.  ?   ?CONCLUSIONS:  ? 1. Successful implantation of a Medtronic Reveal LINQ implantable loop recorder for palpitations and recurrent symptoms of atrial fibrillation ?  2. No early apparent complications.  ? ? ?CLysbeth GalasT. LQuentin Ore MD, [facility]?Cardiac Electrophysiology ? ? ? ?

## 2022-04-02 ENCOUNTER — Ambulatory Visit (INDEPENDENT_AMBULATORY_CARE_PROVIDER_SITE_OTHER): Payer: Medicare Other | Admitting: Cardiology

## 2022-04-02 VITALS — BP 144/82 | HR 61 | Ht 72.0 in | Wt 198.8 lb

## 2022-04-02 DIAGNOSIS — R002 Palpitations: Secondary | ICD-10-CM | POA: Diagnosis not present

## 2022-04-02 DIAGNOSIS — I480 Paroxysmal atrial fibrillation: Secondary | ICD-10-CM | POA: Diagnosis not present

## 2022-04-02 NOTE — Patient Instructions (Addendum)
Medication Instructions:  ?Your physician recommends that you continue on your current medications as directed. Please refer to the Current Medication list given to you today. ? ?Labwork: ?None ordered. ? ?Testing/Procedures: ?None ordered. ? ?Follow-Up: ? ?Your physician wants you to follow-up in: one year with APP in [place]. [place] or [place].  You will receive a reminder letter in the mail two months in advance. If you don't receive a letter, please call our office to schedule the follow-up appointment. ? ? ? ?Implantable Loop Recorder Placement, Care After ?This sheet gives you information about how to care for yourself after your procedure. Your health care provider may also give you more specific instructions. If you have problems or questions, contact your health care provider. ?What can I expect after the procedure? ?After the procedure, it is common to have: ?Soreness or discomfort near the incision. ?Some swelling or bruising near the incision. ? ?Follow these instructions at home: ?Incision care ? ? Leave your outer dressing on for 72 hours.  After 72 hours you can remove your outer dressing and shower. ?Leave adhesive strips in place. These skin closures may need to stay in place for 1-2 weeks. If adhesive strip edges start to loosen and curl up, you may trim the loose edges.  You may remove the strips if they have not fallen off after 2 weeks. ?Check your incision area every day for signs of infection. Check for: ?Redness, swelling, or pain. ?Fluid or blood. ?Warmth. ?Pus or a bad smell. ?Do not take baths, swim, or use a hot tub until your incision is completely healed. ?If your wound site starts to bleed apply pressure.   ?   ?If you have any questions/concerns please call the device clinic at [phone]. ? ?Activity ? ?Return to your normal activities. ? ?General instructions ?Follow instructions from your health care provider about how to manage your implantable loop recorder  and transmit the information. Learn how to activate a recording if this is necessary for your type of device. ?You may go through a metal detection gate, and you may let someone hold a metal detector over your chest. Show your ID card if needed. ?Do not have an MRI unless you check with your health care provider first. ?Take over-the-counter and prescription medicines only as told by your health care provider. ?Keep all follow-up visits as told by your health care provider. This is important. ?Contact a health care provider if: ?You have redness, swelling, or pain around your incision. ?You have a fever. ?You have pain that is not relieved by your pain medicine. ?You have triggered your device because of fainting (syncope) or because of a heartbeat that feels like it is racing, slow, fluttering, or skipping (palpitations). ?Get help right away if you have: ?Chest pain. ?Difficulty breathing. ?Summary ?After the procedure, it is common to have soreness or discomfort near the incision. ?Change your dressing as told by your health care provider. ?Follow instructions from your health care provider about how to manage your implantable loop recorder and transmit the information. ?Keep all follow-up visits as told by your health care provider. This is important. ?This information is not intended to replace advice given to you by your health care provider. Make sure you discuss any questions you have with your health care provider. ?Document Released: 10/29/2015 Document Revised: 01/02/2018 Document Reviewed: 01/02/2018 ?Elsevier Patient Education ? [place].  ?

## 2022-04-18 ENCOUNTER — Encounter: Payer: Self-pay | Admitting: Gastroenterology

## 2022-05-06 ENCOUNTER — Ambulatory Visit (INDEPENDENT_AMBULATORY_CARE_PROVIDER_SITE_OTHER): Payer: Medicare Other

## 2022-05-06 DIAGNOSIS — I480 Paroxysmal atrial fibrillation: Secondary | ICD-10-CM

## 2022-05-07 LAB — CUP PACEART REMOTE DEVICE CHECK
Date Time Interrogation Session: 20230606161138
Implantable Pulse Generator Implant Date: 20230503

## 2022-05-20 NOTE — Progress Notes (Signed)
Carelink Summary Report / Loop Recorder 

## 2022-06-09 ENCOUNTER — Ambulatory Visit (INDEPENDENT_AMBULATORY_CARE_PROVIDER_SITE_OTHER): Payer: Medicare Other

## 2022-06-09 DIAGNOSIS — I480 Paroxysmal atrial fibrillation: Secondary | ICD-10-CM

## 2022-06-09 LAB — CUP PACEART REMOTE DEVICE CHECK
Date Time Interrogation Session: 20230709161410
Implantable Pulse Generator Implant Date: 20230503

## 2022-07-10 NOTE — Progress Notes (Signed)
Carelink Summary Report / Loop Recorder 

## 2022-07-14 ENCOUNTER — Ambulatory Visit: Payer: Medicare Other

## 2022-07-14 LAB — CUP PACEART REMOTE DEVICE CHECK
Date Time Interrogation Session: 20230811161315
Implantable Pulse Generator Implant Date: 20230503

## 2022-07-28 ENCOUNTER — Other Ambulatory Visit (HOSPITAL_COMMUNITY): Payer: Self-pay | Admitting: Physician Assistant

## 2022-08-05 ENCOUNTER — Telehealth: Payer: Self-pay

## 2022-08-05 ENCOUNTER — Encounter: Payer: Self-pay | Admitting: Gastroenterology

## 2022-08-05 NOTE — Telephone Encounter (Signed)
From OV note: Still planning to have him stop his anticoagulation in July of this year if loop recorder monitoring does not show evidence of atrial fibrillation.  We will continue monthly monitoring of his heart rhythms and plan to restart anticoagulation if he were to have a recurrence down the road given his CHA2DS2-VASc.

## 2022-08-05 NOTE — Telephone Encounter (Signed)
Patient called in with questions regarding his eliquis. He states per CL he could stop it eventually and wants to know if that time is now since he has not had any issues

## 2022-08-06 NOTE — Telephone Encounter (Signed)
No AF noted on device.

## 2022-08-11 NOTE — Telephone Encounter (Signed)
Confirmed with Dr. Quentin Ore okay to stop Eliquis. Patient verbalized understanding.

## 2022-08-15 ENCOUNTER — Telehealth: Payer: Self-pay | Admitting: *Deleted

## 2022-08-15 NOTE — Telephone Encounter (Signed)
Spoke with pt. To verify if he was taking a blood thinner ,pt. Informed me that he was taken off his eliquis  by his cardiologist about a week ago,he had ablation in January of 2023 and that he is now taking  81 mg asa and no longer have to take eliquis, please advise if pt. Need OV  or can we proceed with procedure scheduled for 09/17/22

## 2022-08-16 LAB — CUP PACEART REMOTE DEVICE CHECK
Date Time Interrogation Session: 20230913161424
Implantable Pulse Generator Implant Date: 20230503

## 2022-08-16 NOTE — Telephone Encounter (Signed)
I am okay for the patient to be a direct colonoscopy procedure.  He does not need to be seen in the outpatient setting.  Thanks. GM

## 2022-08-18 ENCOUNTER — Ambulatory Visit (INDEPENDENT_AMBULATORY_CARE_PROVIDER_SITE_OTHER): Payer: Medicare Other

## 2022-08-18 DIAGNOSIS — I480 Paroxysmal atrial fibrillation: Secondary | ICD-10-CM | POA: Diagnosis not present

## 2022-08-27 ENCOUNTER — Ambulatory Visit (AMBULATORY_SURGERY_CENTER): Payer: Self-pay | Admitting: *Deleted

## 2022-08-27 VITALS — Ht 72.0 in | Wt 193.2 lb

## 2022-08-27 DIAGNOSIS — Z86010 Personal history of colonic polyps: Secondary | ICD-10-CM

## 2022-08-27 MED ORDER — PEG 3350-KCL-NA BICARB-NACL 420 G PO SOLR: 4000.0000 mL | Freq: Once | ORAL | 0 refills | Status: AC

## 2022-08-27 NOTE — Progress Notes (Signed)
No egg or soy allergy known to patient  No issues known to pt with past sedation with any surgeries or procedures Patient denies ever being told they had issues or difficulty with intubation  No FH of Malignant Hyperthermia Pt is not on diet pills Pt is not on  home 02  Pt is not on blood thinners  Pt denies issues with constipation  H/ON A fib or A flutter,pt.had ablation. Have any cardiac testing pending--NO Pt instructed to use [email or web address] or GoodRx for a price reduction on prep

## 2022-08-29 NOTE — Progress Notes (Signed)
Carelink Summary Report / Loop Recorder 

## 2022-09-17 ENCOUNTER — Encounter: Payer: Self-pay | Admitting: Gastroenterology

## 2022-09-17 ENCOUNTER — Ambulatory Visit (AMBULATORY_SURGERY_CENTER): Payer: Medicare Other | Admitting: Gastroenterology

## 2022-09-17 VITALS — BP 110/72 | HR 53 | Temp 97.3°F | Resp 12 | Ht 72.0 in | Wt 193.2 lb

## 2022-09-17 DIAGNOSIS — K635 Polyp of colon: Secondary | ICD-10-CM | POA: Diagnosis not present

## 2022-09-17 DIAGNOSIS — K621 Rectal polyp: Secondary | ICD-10-CM

## 2022-09-17 DIAGNOSIS — Z09 Encounter for follow-up examination after completed treatment for conditions other than malignant neoplasm: Secondary | ICD-10-CM

## 2022-09-17 DIAGNOSIS — D127 Benign neoplasm of rectosigmoid junction: Secondary | ICD-10-CM

## 2022-09-17 DIAGNOSIS — Z86010 Personal history of colonic polyps: Secondary | ICD-10-CM

## 2022-09-17 DIAGNOSIS — D128 Benign neoplasm of rectum: Secondary | ICD-10-CM

## 2022-09-17 DIAGNOSIS — D124 Benign neoplasm of descending colon: Secondary | ICD-10-CM

## 2022-09-17 LAB — CUP PACEART REMOTE DEVICE CHECK
Date Time Interrogation Session: 20231016161337
Implantable Pulse Generator Implant Date: 20230503

## 2022-09-17 MED ORDER — SODIUM CHLORIDE 0.9 % IV SOLN: 500.0000 mL | Freq: Once | INTRAVENOUS | Status: DC

## 2022-09-17 NOTE — Op Note (Signed)
[place] Patient Name: Brandon [name] Procedure Date: 09/17/2022 8:26 AM MRN: [id number] Endoscopist: Justice Britain , MD Age: 71 Referring MD:  Date of Birth: 01/24/1951 Gender: Male Account #: [id number] Procedure:                Colonoscopy Indications:              Surveillance: Personal history of adenomatous                            polyps on last colonoscopy 3 years ago Medicines:                Monitored Anesthesia Care Procedure:                Pre-Anesthesia Assessment:                           - Prior to the procedure, a History and Physical                            was performed, and patient medications and                            allergies were reviewed. The patient's tolerance of                            previous anesthesia was also reviewed. The risks                            and benefits of the procedure and the sedation                            options and risks were discussed with the patient.                            All questions were answered, and informed consent                            was obtained. Prior Anticoagulants: The patient has                            taken no previous anticoagulant or antiplatelet                            agents except for aspirin. ASA Grade Assessment:                            III - A patient with severe systemic disease. After                            reviewing the risks and benefits, the patient was                            deemed in satisfactory condition to undergo the  procedure.                           After obtaining informed consent, the colonoscope                            was passed under direct vision. Throughout the                            procedure, the patient's blood pressure, pulse, and                            oxygen saturations were monitored continuously. The                            CF [id number] #[id number] was introduced through the anus                             and advanced to the 5 cm into the ileum. The                            colonoscopy was performed without difficulty. The                            patient tolerated the procedure. The quality of the                            bowel preparation was adequate. Scope In: 8:41:32 AM Scope Out: 8:57:39 AM Scope Withdrawal Time: 0 hours 13 minutes 22 seconds  Total Procedure Duration: 0 hours 16 minutes 7 seconds  Findings:                 The digital rectal exam findings include                            hemorrhoids. Pertinent negatives include no                            palpable rectal lesions.                           The terminal ileum and ileocecal valve appeared                            normal.                           Seven sessile polyps were found in the rectum (1),                            recto-sigmoid colon (4), and descending colon (2).                            The polyps were 2 to 10 mm in size. These polyps  were removed with a cold snare. Resection and                            retrieval were complete.                           Multiple small and large-mouthed diverticula were                            found in the recto-sigmoid colon, sigmoid colon and                            ascending colon.                           Normal mucosa was found in the entire colon                            otherwise.                           Anal papilla was hypertrophied.                           Non-bleeding non-thrombosed internal hemorrhoids                            were found during retroflexion, during perianal                            exam and during digital exam. The hemorrhoids were                            Grade II (internal hemorrhoids that prolapse but                            reduce spontaneously). Complications:            No immediate complications. Estimated Blood Loss:     Estimated blood loss was  minimal. Impression:               - Hemorrhoids found on digital rectal exam.                           - The examined portion of the ileum was normal.                           - Seven, 2 to 10 mm polyps in the rectum, at the                            recto-sigmoid colon and in the descending colon,                            removed with a cold snare. Resected and retrieved.                           -  Diverticulosis in the recto-sigmoid colon, in the                            sigmoid colon and in the ascending colon.                           - Normal mucosa in the entire examined colon                            otherwise.                           - Anal papilla was hypertrophied.                           - Non-bleeding non-thrombosed internal hemorrhoids. Recommendation:           - The patient will be observed post-procedure,                            until all discharge criteria are met.                           - Discharge patient to home.                           - Patient has a contact number available for                            emergencies. The signs and symptoms of potential                            delayed complications were discussed with the                            patient. Return to normal activities tomorrow.                            Written discharge instructions were provided to the                            patient.                           - High fiber diet.                           - Use FiberCon 1-2 tablets PO daily.                           - Await pathology results.                           - Repeat colonoscopy in 3/5/7 years for                            surveillance and findings on  final pathology.                           - The findings and recommendations were discussed                            with the patient.                           - The findings and recommendations were discussed                            with the  designated responsible adult. Justice Britain, MD 09/17/2022 9:03:06 AM

## 2022-09-17 NOTE — Progress Notes (Signed)
Called to room to assist during endoscopic procedure.  Patient ID and intended procedure confirmed with present staff. Received instructions for my participation in the procedure from the performing physician.  

## 2022-09-17 NOTE — Patient Instructions (Signed)
Read all of the handouts given to you by your recovery room nurse.  Use fiber-con 1-2 tablets daily.   YOU HAD AN ENDOSCOPIC PROCEDURE TODAY AT [facility]:   Refer to the procedure report that was given to you for any specific questions about what was found during the examination.  If the procedure report does not answer your questions, please call your gastroenterologist to clarify.  If you requested that your care partner not be given the details of your procedure findings, then the procedure report has been included in a sealed envelope for you to review at your convenience later.  YOU SHOULD EXPECT: Some feelings of bloating in the abdomen. Passage of more gas than usual.  Walking can help get rid of the air that was put into your GI tract during the procedure and reduce the bloating. If you had a lower endoscopy (such as a colonoscopy or flexible sigmoidoscopy) you may notice spotting of blood in your stool or on the toilet paper. If you underwent a bowel prep for your procedure, you may not have a normal bowel movement for a few days.  Please Note:  You might notice some irritation and congestion in your nose or some drainage.  This is from the oxygen used during your procedure.  There is no need for concern and it should clear up in a day or so.  SYMPTOMS TO REPORT IMMEDIATELY:  Following lower endoscopy (colonoscopy or flexible sigmoidoscopy):  Excessive amounts of blood in the stool  Significant tenderness or worsening of abdominal pains  Swelling of the abdomen that is new, acute  Fever of 100F or higher   For urgent or emergent issues, a gastroenterologist can be reached at any hour by calling [phone]. Do not use MyChart messaging for urgent concerns.    DIET:  We do recommend a small meal at first, but then you may proceed to your regular diet.  Drink plenty of fluids but you should avoid alcoholic beverages for 24 hours. Try to increase the fiber in your  diet, and drink plenty of water.  ACTIVITY:  You should plan to take it easy for the rest of today and you should NOT DRIVE or use heavy machinery until tomorrow (because of the sedation medicines used during the test).    FOLLOW UP: Our staff will call the number listed on your records the next business day following your procedure.  We will call around 7:15- 8:00 am to check on you and address any questions or concerns that you may have regarding the information given to you following your procedure. If we do not reach you, we will leave a message.     If any biopsies were taken you will be contacted by phone or by letter within the next 1-3 weeks.  Please call us at [phone] if you have not heard about the biopsies in 3 weeks.    SIGNATURES/CONFIDENTIALITY: You and/or your care partner have signed paperwork which will be entered into your electronic medical record.  These signatures attest to the fact that that the information above on your After Visit Summary has been reviewed and is understood.  Full responsibility of the confidentiality of this discharge information lies with you and/or your care-partner.

## 2022-09-17 NOTE — Progress Notes (Signed)
Pt's states no medical or surgical changes since previsit or office visit. VS assessed by D.T 

## 2022-09-17 NOTE — Progress Notes (Signed)
Pt resting comfortably. VSS. Airway intact. SBAR complete to RN. All questions answered.   

## 2022-09-17 NOTE — Progress Notes (Signed)
GASTROENTEROLOGY PROCEDURE H&P NOTE   Primary Care Physician: Alroy Dust, L.Marlou Sa, MD  HPI: JAQUESE [name] is a 71 y.o. male who presents for Colonoscopy for surveillance in setting of previous SSAs and TAs.  Past Medical History:  Diagnosis Date   Allergy    pollen this yr 2020    Diverticulosis    Gout    Hx of adenomatous colonic polyps    Hx of atrial fibrillation    Hyperlipidemia    Irregular heart beats    "for a while"   Past Surgical History:  Procedure Laterality Date   APPENDECTOMY  1968   ATRIAL FIBRILLATION ABLATION N/A 12/19/2021   Procedure: ATRIAL FIBRILLATION ABLATION;  Surgeon: Vickie Epley, MD;  Location: [facility];  Service: Cardiovascular;  Laterality: N/A;   COLONOSCOPY     EXTERNAL EAR SURGERY  1997, 2000   HEART DEVICE Left    LEFT CHEST,MOINTORS HEART   HEMORRHOID SURGERY  1987   POLYPECTOMY     Current Outpatient Medications  Medication Sig Dispense Refill   aspirin EC 81 MG tablet Take 81 mg by mouth daily. Swallow whole.     atorvastatin (LIPITOR) 40 MG tablet Take 40 mg by mouth daily.     Cyanocobalamin (VITAMIN B 12 PO) Take by mouth daily. Take one daily     diltiazem (CARDIZEM) 30 MG tablet Take 1 tablet every 4 hours AS NEEDED for heart rate >100 (Patient not taking: Reported on 08/27/2022) 30 tablet 1   metoprolol succinate (TOPROL XL) 25 MG 24 hr tablet Take 1 tablet (25 mg total) by mouth at bedtime. 90 tablet 3   Multiple Vitamin (THERA) TABS Take 1 tablet by mouth daily.     OMEGA 3 1000 MG CAPS Take 1,000 mg by mouth daily.     Red Yeast Rice Extract (RED YEAST RICE PO) Take 1 tablet by mouth daily.     sildenafil (REVATIO) 20 MG tablet Take 60 mg by mouth daily as needed (ED).     TART CHERRY PO Take 1 tablet by mouth daily.     Current Facility-Administered Medications  Medication Dose Route Frequency Provider Last Rate Last Admin   0.9 %  sodium chloride infusion  500 mL Intravenous Once Mansouraty, Telford Nab.,  MD        Current Outpatient Medications:    aspirin EC 81 MG tablet, Take 81 mg by mouth daily. Swallow whole., Disp: , Rfl:    atorvastatin (LIPITOR) 40 MG tablet, Take 40 mg by mouth daily., Disp: , Rfl:    Cyanocobalamin (VITAMIN B 12 PO), Take by mouth daily. Take one daily, Disp: , Rfl:    diltiazem (CARDIZEM) 30 MG tablet, Take 1 tablet every 4 hours AS NEEDED for heart rate >100 (Patient not taking: Reported on 08/27/2022), Disp: 30 tablet, Rfl: 1   metoprolol succinate (TOPROL XL) 25 MG 24 hr tablet, Take 1 tablet (25 mg total) by mouth at bedtime., Disp: 90 tablet, Rfl: 3   Multiple Vitamin (THERA) TABS, Take 1 tablet by mouth daily., Disp: , Rfl:    OMEGA 3 1000 MG CAPS, Take 1,000 mg by mouth daily., Disp: , Rfl:    Red Yeast Rice Extract (RED YEAST RICE PO), Take 1 tablet by mouth daily., Disp: , Rfl:    sildenafil (REVATIO) 20 MG tablet, Take 60 mg by mouth daily as needed (ED)., Disp: , Rfl:    TART CHERRY PO, Take 1 tablet by mouth daily., Disp: ,  Rfl:   Current Facility-Administered Medications:    0.9 %  sodium chloride infusion, 500 mL, Intravenous, Once, Mansouraty, Telford Nab., MD Allergies  Allergen Reactions   Orange Concentrate [Flavoring Agent] Shortness Of Breath and Swelling    Orange flavor dye   Family History  Problem Relation Age of Onset   Diabetes Father    Heart attack Brother    Sudden death Brother    Hyperlipidemia Neg Hx    Hypertension Neg Hx    Colon cancer Neg Hx    Colon polyps Neg Hx    Esophageal cancer Neg Hx    Rectal cancer Neg Hx    Stomach cancer Neg Hx    Crohn's disease Neg Hx    Ulcerative colitis Neg Hx    Social History   Socioeconomic History   Marital status: Single    Spouse name: Not on file   Number of children: Not on file   Years of education: Not on file   Highest education level: Not on file  Occupational History   Not on file  Tobacco Use   Smoking status: Former    Packs/day: 0.25    Types: Cigarettes     Quit date: 05/01/2018    Years since quitting: 4.3    Passive exposure: Never   Smokeless tobacco: Current    Types: Snuff   Tobacco comments:    Nicotine only pouches 01/02/22  Vaping Use   Vaping Use: Never used  Substance and Sexual Activity   Alcohol use: Yes    Alcohol/week: 20.0 - 25.0 standard drinks of alcohol    Types: 20 - 25 Standard drinks or equivalent per week    Comment: 4-5 mixed daily 5 days week 01/02/22   Drug use: No   Sexual activity: Not on file  Other Topics Concern   Not on file  Social History Narrative   Not on file   Social Determinants of Health   Financial Resource Strain: Not on file  Food Insecurity: Not on file  Transportation Needs: Not on file  Physical Activity: Not on file  Stress: Not on file  Social Connections: Not on file  Intimate Partner Violence: Not on file    Physical Exam: Today's Vitals   09/17/22 0755  BP: 137/72  Pulse: (!) 58  Temp: (!) 97.3 F (36.3 C)  TempSrc: Skin  SpO2: 98%  Weight: 193 lb 3.2 oz (87.6 kg)  Height: 6' (1.829 m)   Body mass index is 26.2 kg/m. GEN: NAD EYE: Sclerae anicteric ENT: MMM CV: Non-tachycardic GI: Soft, NT/ND NEURO:  Alert & Oriented x 3  Lab Results: No results for input(s): "WBC", "HGB", "HCT", "PLT" in the last 72 hours. BMET No results for input(s): "NA", "K", "CL", "CO2", "GLUCOSE", "BUN", "CREATININE", "CALCIUM" in the last 72 hours. LFT No results for input(s): "PROT", "ALBUMIN", "AST", "ALT", "ALKPHOS", "BILITOT", "BILIDIR", "IBILI" in the last 72 hours. PT/INR No results for input(s): "LABPROT", "INR" in the last 72 hours.   Impression / Plan: This is a 71 y.o.male who presents for Colonoscopy for surveillance in setting of previous SSAs and TAs.  The risks and benefits of endoscopic evaluation/treatment were discussed with the patient and/or family; these include but are not limited to the risk of perforation, infection, bleeding, missed lesions, lack of  diagnosis, severe illness requiring hospitalization, as well as anesthesia and sedation related illnesses.  The patient's history has been reviewed, patient examined, no change in status, and deemed stable for procedure.  The patient and/or family is agreeable to proceed.    Justice Britain, MD [facility]oscopy Office # [phone]

## 2022-09-18 ENCOUNTER — Telehealth: Payer: Self-pay

## 2022-09-18 NOTE — Telephone Encounter (Signed)
  Follow up Call-     09/17/2022    7:55 AM  Call back number  Post procedure Call Back phone  # [phone]  Permission to leave phone message Yes     Patient questions:  Do you have a fever, pain , or abdominal swelling? No. Pain Score  0 *  Have you tolerated food without any problems? Yes.    Have you been able to return to your normal activities? Yes.    Do you have any questions about your discharge instructions: Diet   No. Medications  No. Follow up visit  No.  Do you have questions or concerns about your Care? No.  Actions: * If pain score is 4 or above: No action needed, pain <4.

## 2022-09-19 ENCOUNTER — Encounter: Payer: Self-pay | Admitting: Gastroenterology

## 2022-09-22 ENCOUNTER — Ambulatory Visit (INDEPENDENT_AMBULATORY_CARE_PROVIDER_SITE_OTHER): Payer: Medicare Other

## 2022-09-22 DIAGNOSIS — R002 Palpitations: Secondary | ICD-10-CM

## 2022-09-22 DIAGNOSIS — I480 Paroxysmal atrial fibrillation: Secondary | ICD-10-CM

## 2022-10-13 NOTE — Progress Notes (Signed)
Carelink Summary Report / Loop Recorder 

## 2022-10-27 ENCOUNTER — Ambulatory Visit (INDEPENDENT_AMBULATORY_CARE_PROVIDER_SITE_OTHER): Payer: Medicare Other

## 2022-10-27 DIAGNOSIS — R002 Palpitations: Secondary | ICD-10-CM | POA: Diagnosis not present

## 2022-10-27 LAB — CUP PACEART REMOTE DEVICE CHECK
Date Time Interrogation Session: 20231126231021
Implantable Pulse Generator Implant Date: 20230503

## 2022-12-02 ENCOUNTER — Ambulatory Visit (INDEPENDENT_AMBULATORY_CARE_PROVIDER_SITE_OTHER): Payer: Medicare Other

## 2022-12-02 DIAGNOSIS — R002 Palpitations: Secondary | ICD-10-CM | POA: Diagnosis not present

## 2022-12-02 LAB — CUP PACEART REMOTE DEVICE CHECK
Date Time Interrogation Session: 20240101230747
Implantable Pulse Generator Implant Date: 20230503

## 2022-12-05 NOTE — Progress Notes (Signed)
Carelink Summary Report / Loop Recorder 

## 2022-12-18 ENCOUNTER — Ambulatory Visit (INDEPENDENT_AMBULATORY_CARE_PROVIDER_SITE_OTHER): Payer: Medicare Other | Admitting: Podiatry

## 2022-12-18 ENCOUNTER — Ambulatory Visit (INDEPENDENT_AMBULATORY_CARE_PROVIDER_SITE_OTHER): Payer: Medicare Other

## 2022-12-18 ENCOUNTER — Encounter: Payer: Self-pay | Admitting: Podiatry

## 2022-12-18 VITALS — BP 150/84 | HR 68

## 2022-12-18 DIAGNOSIS — K5790 Diverticulosis of intestine, part unspecified, without perforation or abscess without bleeding: Secondary | ICD-10-CM | POA: Insufficient documentation

## 2022-12-18 DIAGNOSIS — S9002XA Contusion of left ankle, initial encounter: Secondary | ICD-10-CM | POA: Diagnosis not present

## 2022-12-18 NOTE — Progress Notes (Signed)
Subjective:  Patient ID: Brandon [name], male    DOB: 1951/07/09,  MRN: [id number] HPI Chief Complaint  Patient presents with   Foot Injury    Ankle left - [name] in [place] 2 weeks ago, swollen, discolored, more pain into lower leg, tried acewrap, motrin, iced, hurts weight bearing    New Patient (Initial Visit)    72 y.o. male presents with the above complaint.   ROS: Denies fever chills nausea vomiting muscle aches pains calf pain back pain chest pain shortness of breath.  Past Medical History:  Diagnosis Date   Allergy    pollen this yr 2020    Diverticulosis    Gout    Hx of adenomatous colonic polyps    Hx of atrial fibrillation    Hyperlipidemia    Irregular heart beats    "for a while"   Past Surgical History:  Procedure Laterality Date   APPENDECTOMY  1968   ATRIAL FIBRILLATION ABLATION N/A 12/19/2021   Procedure: ATRIAL FIBRILLATION ABLATION;  Surgeon: Vickie Epley, MD;  Location: [facility];  Service: Cardiovascular;  Laterality: N/A;   COLONOSCOPY     EXTERNAL EAR SURGERY  1997, 2000   HEART DEVICE Left    LEFT CHEST,MOINTORS HEART   HEMORRHOID SURGERY  1987   POLYPECTOMY      Current Outpatient Medications:    aspirin EC 81 MG tablet, Take 81 mg by mouth daily. Swallow whole., Disp: , Rfl:    atorvastatin (LIPITOR) 40 MG tablet, Take 40 mg by mouth daily., Disp: , Rfl:    Cyanocobalamin (VITAMIN B 12 PO), Take by mouth daily. Take one daily, Disp: , Rfl:    diltiazem (CARDIZEM) 30 MG tablet, Take 1 tablet every 4 hours AS NEEDED for heart rate >100 (Patient not taking: Reported on 08/27/2022), Disp: 30 tablet, Rfl: 1   metoprolol succinate (TOPROL XL) 25 MG 24 hr tablet, Take 1 tablet (25 mg total) by mouth at bedtime., Disp: 90 tablet, Rfl: 3   Multiple Vitamin (THERA) TABS, Take 1 tablet by mouth daily., Disp: , Rfl:    OMEGA 3 1000 MG CAPS, Take 1,000 mg by mouth daily., Disp: , Rfl:    Red Yeast Rice Extract (RED YEAST RICE PO), Take 1 tablet  by mouth daily., Disp: , Rfl:    sildenafil (REVATIO) 20 MG tablet, Take 60 mg by mouth daily as needed (ED)., Disp: , Rfl:    TART CHERRY PO, Take 1 tablet by mouth daily., Disp: , Rfl:   Allergies  Allergen Reactions   Orange Concentrate [Flavoring Agent] Shortness Of Breath and Swelling    Orange flavor dye   Review of Systems Objective:   Vitals:   12/18/22 1534  BP: (!) 150/84  Pulse: 68    General: Well developed, nourished, in no acute distress, alert and oriented x3   Dermatological: Skin is warm, dry and supple bilateral. Nails x 10 are well maintained; remaining integument appears unremarkable at this time. There are no open sores, no preulcerative lesions, no rash or signs of infection present.  Vascular: Dorsalis Pedis artery and Posterior Tibial artery pedal pulses are 2/4 bilateral with immedate capillary fill time. Pedal hair growth present. No varicosities and no lower extremity edema present bilateral.   Neruologic: Grossly intact via light touch bilateral. Vibratory intact via tuning fork bilateral. Protective threshold with Semmes Wienstein monofilament intact to all pedal sites bilateral. Patellar and Achilles deep tendon reflexes 2+ bilateral. No Babinski or clonus noted  bilateral.   Musculoskeletal: No gross boney pedal deformities bilateral. No pain, crepitus, or limitation noted with foot and ankle range of motion bilateral. Muscular strength 5/5 in all groups tested bilateral.  Has pain in the calf on medial lateral compression.  He also has tenderness in his Achilles which appears to be intact but there is fluctuance surrounding it.  I do not feel avoiding on palpation.  His left foot is swollen with some ecchymosis around his toes.  Gait: Unassisted, Nonantalgic.    Radiographs:  Radiographs of the ankle and foot taken today demonstrate an osseously mature foot considerable swelling of the ankle mortise appears to be normal do not see any fractures at this  point.  Assessment & Plan:   Assessment: P probable grade 3 ankle sprain left but cannot rule out a DVT or muscle tear gastrosoleus complex left.  Plan: Requesting a stat ultrasound of the calf to rule out deep DVT recommended that he start baby aspirin once a day if he is not doing that also recommended moist heat to the calf.  Cold compresses or ice packs to the anterior lateral ankle and the Achilles area.  I also placed him in a cam boot.  Also placed him in a compression anklet.     Kena Limon T. [place], [place]

## 2022-12-19 ENCOUNTER — Ambulatory Visit (HOSPITAL_COMMUNITY)
Admission: RE | Admit: 2022-12-19 | Discharge: 2022-12-19 | Disposition: A | Discharge status: Home / Self Care | Payer: Medicare Other | Source: Ambulatory Visit | Attending: Podiatry | Admitting: Podiatry

## 2022-12-19 DIAGNOSIS — S9002XA Contusion of left ankle, initial encounter: Secondary | ICD-10-CM | POA: Diagnosis not present

## 2022-12-26 NOTE — Progress Notes (Signed)
[facility] Summary Report / Loop Recorder

## 2023-01-01 ENCOUNTER — Ambulatory Visit (INDEPENDENT_AMBULATORY_CARE_PROVIDER_SITE_OTHER): Payer: Medicare Other | Admitting: Podiatry

## 2023-01-01 DIAGNOSIS — M25372 Other instability, left ankle: Secondary | ICD-10-CM | POA: Diagnosis not present

## 2023-01-01 DIAGNOSIS — S93492A Sprain of other ligament of left ankle, initial encounter: Secondary | ICD-10-CM | POA: Diagnosis not present

## 2023-01-03 NOTE — Progress Notes (Signed)
He presents today for follow-up of his left ankle pain he states that the pain is about a 3 out of 10 in the cam boot and the compression sock he has been wearing.  States that down without the boot is still quite sore.  Objective: Vital signs are stable alert oriented x 3.  Pulses are palpable.  Has significant pain and laxity on stress and on direct palpation of the anterior talofibular ligament and calcaneofibular ligament left lower extremity.  Assessment: Probable tear of the anterior talofibular ligament and calcaneofibular ligament traumatic in nature.  Plan: Currently requesting MRI of the left ankle secondary to traumatic injury and failure of conservative therapy to alleviate this patient's symptomatology.  MRI will assist in surgical planning as well as differential diagnoses.

## 2023-01-04 LAB — CUP PACEART REMOTE DEVICE CHECK
Date Time Interrogation Session: 20240203230525
Implantable Pulse Generator Implant Date: 20230503

## 2023-01-05 ENCOUNTER — Ambulatory Visit: Payer: Medicare Other | Attending: Cardiology

## 2023-01-05 DIAGNOSIS — I480 Paroxysmal atrial fibrillation: Secondary | ICD-10-CM | POA: Diagnosis not present

## 2023-01-10 ENCOUNTER — Ambulatory Visit
Admission: RE | Admit: 2023-01-10 | Discharge: 2023-01-10 | Disposition: A | Discharge status: Home / Self Care | Payer: Medicare Other | Source: Ambulatory Visit | Attending: Podiatry | Admitting: Podiatry

## 2023-01-10 DIAGNOSIS — M25372 Other instability, left ankle: Secondary | ICD-10-CM

## 2023-01-10 DIAGNOSIS — S93492A Sprain of other ligament of left ankle, initial encounter: Secondary | ICD-10-CM

## 2023-01-13 ENCOUNTER — Telehealth: Payer: Self-pay | Admitting: *Deleted

## 2023-01-13 NOTE — Telephone Encounter (Signed)
Faxed form to [place]  over read,[place] for disc-01/14/23.

## 2023-01-13 NOTE — Progress Notes (Signed)
Faxed to [facility] imaging request for disc to be sent to [facility] over read, patient updated and wanted to know if he should cancel tomorrow's appointment

## 2023-01-14 ENCOUNTER — Ambulatory Visit: Payer: Medicare Other | Admitting: Podiatry

## 2023-01-14 NOTE — Telephone Encounter (Signed)
Pt states that he would like to call off the second opinion mri order. He is getting it done next week at another office.   Also he would like a copy of his xray that was done here in the office. He can come by to pick up when ready.  Please advise.

## 2023-01-22 ENCOUNTER — Telehealth: Payer: Self-pay | Admitting: Podiatry

## 2023-01-22 NOTE — Telephone Encounter (Signed)
Pt states that he seen another provider for a second opinion and was recommended to do physial therapy. He is unable to get in soon for PT so he is wanting to come through [place] to see if Dr Milinda Pointer will put in an order for PT within our network.   Please advise

## 2023-02-06 ENCOUNTER — Ambulatory Visit: Payer: Medicare Other

## 2023-02-06 DIAGNOSIS — I4891 Unspecified atrial fibrillation: Secondary | ICD-10-CM | POA: Diagnosis not present

## 2023-02-06 LAB — CUP PACEART REMOTE DEVICE CHECK
Date Time Interrogation Session: 20240307230505
Implantable Pulse Generator Implant Date: 20230503

## 2023-02-13 NOTE — Progress Notes (Signed)
Carelink Summary Report / Loop Recorder 

## 2023-03-06 NOTE — Progress Notes (Signed)
n

## 2023-03-11 ENCOUNTER — Ambulatory Visit (INDEPENDENT_AMBULATORY_CARE_PROVIDER_SITE_OTHER): Payer: Medicare Other

## 2023-03-11 DIAGNOSIS — I4891 Unspecified atrial fibrillation: Secondary | ICD-10-CM

## 2023-03-11 LAB — CUP PACEART REMOTE DEVICE CHECK
Date Time Interrogation Session: 20240409230352
Implantable Pulse Generator Implant Date: 20230503

## 2023-04-13 ENCOUNTER — Ambulatory Visit (INDEPENDENT_AMBULATORY_CARE_PROVIDER_SITE_OTHER): Payer: Medicare Other

## 2023-04-13 DIAGNOSIS — I480 Paroxysmal atrial fibrillation: Secondary | ICD-10-CM | POA: Diagnosis not present

## 2023-04-13 LAB — CUP PACEART REMOTE DEVICE CHECK
Date Time Interrogation Session: 20240512230436
Implantable Pulse Generator Implant Date: 20230503

## 2023-04-17 NOTE — Progress Notes (Signed)
Carelink Summary Report / Loop Recorder 

## 2023-05-06 NOTE — Progress Notes (Signed)
Carelink Summary Report / Loop Recorder 

## 2023-05-18 ENCOUNTER — Ambulatory Visit (INDEPENDENT_AMBULATORY_CARE_PROVIDER_SITE_OTHER): Payer: Medicare Other

## 2023-05-18 DIAGNOSIS — I480 Paroxysmal atrial fibrillation: Secondary | ICD-10-CM | POA: Diagnosis not present

## 2023-05-18 LAB — CUP PACEART REMOTE DEVICE CHECK
Date Time Interrogation Session: 20240614230918
Implantable Pulse Generator Implant Date: 20230503

## 2023-06-05 NOTE — Progress Notes (Signed)
Carelink Summary Report / Loop Recorder 

## 2023-06-18 ENCOUNTER — Ambulatory Visit (INDEPENDENT_AMBULATORY_CARE_PROVIDER_SITE_OTHER): Payer: Medicare Other

## 2023-06-18 DIAGNOSIS — I480 Paroxysmal atrial fibrillation: Secondary | ICD-10-CM | POA: Diagnosis not present

## 2023-06-18 LAB — CUP PACEART REMOTE DEVICE CHECK
Date Time Interrogation Session: 20240717230438
Implantable Pulse Generator Implant Date: 20230503

## 2023-06-22 ENCOUNTER — Ambulatory Visit: Payer: Medicare Other

## 2023-06-25 ENCOUNTER — Other Ambulatory Visit: Payer: Self-pay | Admitting: Cardiology

## 2023-07-02 NOTE — Progress Notes (Signed)
Carelink Summary Report / Loop Recorder 

## 2023-07-21 ENCOUNTER — Ambulatory Visit: Payer: Medicare Other

## 2023-07-21 DIAGNOSIS — I480 Paroxysmal atrial fibrillation: Secondary | ICD-10-CM | POA: Diagnosis not present

## 2023-07-21 DIAGNOSIS — R002 Palpitations: Secondary | ICD-10-CM

## 2023-07-21 LAB — CUP PACEART REMOTE DEVICE CHECK
Date Time Interrogation Session: 20240819230429
Implantable Pulse Generator Implant Date: 20230503

## 2023-07-27 ENCOUNTER — Other Ambulatory Visit: Payer: Self-pay | Admitting: Cardiology

## 2023-07-27 ENCOUNTER — Ambulatory Visit: Payer: Medicare Other

## 2023-07-31 ENCOUNTER — Other Ambulatory Visit: Payer: Self-pay | Admitting: Cardiology

## 2023-07-31 NOTE — Progress Notes (Signed)
Carelink Summary Report / Loop Recorder 

## 2023-08-09 ENCOUNTER — Other Ambulatory Visit: Payer: Self-pay | Admitting: Cardiology

## 2023-08-17 ENCOUNTER — Other Ambulatory Visit: Payer: Self-pay | Admitting: Family Medicine

## 2023-08-17 DIAGNOSIS — Z136 Encounter for screening for cardiovascular disorders: Secondary | ICD-10-CM

## 2023-08-19 ENCOUNTER — Ambulatory Visit
Admission: RE | Admit: 2023-08-19 | Discharge: 2023-08-19 | Disposition: A | Discharge status: Home / Self Care | Payer: Medicare Other | Source: Ambulatory Visit | Attending: Family Medicine | Admitting: Family Medicine

## 2023-08-19 DIAGNOSIS — Z136 Encounter for screening for cardiovascular disorders: Secondary | ICD-10-CM

## 2023-08-21 ENCOUNTER — Encounter: Payer: Self-pay | Admitting: Acute Care

## 2023-08-24 ENCOUNTER — Ambulatory Visit: Payer: Medicare Other

## 2023-08-24 DIAGNOSIS — I480 Paroxysmal atrial fibrillation: Secondary | ICD-10-CM | POA: Diagnosis not present

## 2023-08-24 DIAGNOSIS — R002 Palpitations: Secondary | ICD-10-CM

## 2023-08-24 LAB — CUP PACEART REMOTE DEVICE CHECK
Date Time Interrogation Session: 20240921230458
Implantable Pulse Generator Implant Date: 20230503

## 2023-08-26 ENCOUNTER — Telehealth: Payer: Self-pay | Admitting: Acute Care

## 2023-08-26 ENCOUNTER — Other Ambulatory Visit: Payer: Self-pay

## 2023-08-26 DIAGNOSIS — Z122 Encounter for screening for malignant neoplasm of respiratory organs: Secondary | ICD-10-CM

## 2023-08-26 DIAGNOSIS — Z87891 Personal history of nicotine dependence: Secondary | ICD-10-CM

## 2023-08-26 NOTE — Telephone Encounter (Signed)
Patient has been scheduled for sdmv and ldct

## 2023-08-26 NOTE — Telephone Encounter (Signed)
PT got a letter w/the wrong call back number for [place]. Wants to sched a LCS. Please call @ [phone]   Letter states:   Dear Parent/Guardian of [place]:   We received a referral for you to see a physician in our clinic.   Please call us at [phone] to schedule.

## 2023-08-31 ENCOUNTER — Ambulatory Visit: Payer: Medicare Other

## 2023-09-02 ENCOUNTER — Encounter: Payer: Self-pay | Admitting: Adult Health

## 2023-09-02 ENCOUNTER — Ambulatory Visit (INDEPENDENT_AMBULATORY_CARE_PROVIDER_SITE_OTHER): Payer: Medicare Other | Admitting: Adult Health

## 2023-09-02 DIAGNOSIS — Z87891 Personal history of nicotine dependence: Secondary | ICD-10-CM | POA: Diagnosis not present

## 2023-09-02 NOTE — Patient Instructions (Signed)

## 2023-09-02 NOTE — Progress Notes (Signed)
  Virtual Visit via Telephone Note  I connected with Brandon [name] , 09/02/23 11:04 AM by a telemedicine application and verified that I am speaking with the correct person using two identifiers.  Location: Patient: home Provider: home   I discussed the limitations of evaluation and management by telemedicine and the availability of in person appointments. The patient expressed understanding and agreed to proceed.   Shared Decision Making Visit Lung Cancer Screening Program [phone])   Eligibility: 72 y.o. Pack Years Smoking History Calculation =52 (# packs/per year x # years smoked) 33yrs x 1ppd  Recent History of coughing up blood  no Unexplained weight loss? no ( >Than 15 pounds within the last 6 months ) Prior History Lung / other cancer =melanoma (Diagnosis within the last 5 years already requiring surveillance chest CT Scans). Smoking Status Former Smoker Former Smokers: Years since quit: 6 years  Quit Date: 2018  Visit Components: Discussion included one or more decision making aids. YES Discussion included risk/benefits of screening. YES Discussion included potential follow up diagnostic testing for abnormal scans. YES Discussion included meaning and risk of over diagnosis. YES Discussion included meaning and risk of False Positives. YES Discussion included meaning of total radiation exposure. YES  Counseling Included: Importance of adherence to annual lung cancer LDCT screening. YES Impact of comorbidities on ability to participate in the program. YES Ability and willingness to under diagnostic treatment. YES  Smoking Cessation Counseling: Former Smokers:  Discussed the importance of maintaining cigarette abstinence. yes Diagnosis Code: Personal History of Nicotine Dependence. G29.528 Information about tobacco cessation classes and interventions provided to patient. Yes Patient provided with "ticket" for LDCT Scan. yes Written Order for Lung Cancer Screening  with LDCT placed in Epic. Yes (CT Chest Lung Cancer Screening Low Dose W/O CM) [id number]  Z12.2-Screening of respiratory organs Z87.891-Personal history of nicotine dependence   Brandon [name] 09/02/23

## 2023-09-03 ENCOUNTER — Ambulatory Visit
Admission: RE | Admit: 2023-09-03 | Discharge: 2023-09-03 | Disposition: A | Discharge status: Home / Self Care | Payer: Medicare Other | Source: Ambulatory Visit | Attending: Family Medicine | Admitting: Family Medicine

## 2023-09-03 DIAGNOSIS — Z87891 Personal history of nicotine dependence: Secondary | ICD-10-CM

## 2023-09-03 DIAGNOSIS — Z122 Encounter for screening for malignant neoplasm of respiratory organs: Secondary | ICD-10-CM

## 2023-09-03 NOTE — Progress Notes (Signed)
Carelink Summary Report / Loop Recorder 

## 2023-09-21 ENCOUNTER — Telehealth: Payer: Self-pay | Admitting: Acute Care

## 2023-09-21 DIAGNOSIS — Z122 Encounter for screening for malignant neoplasm of respiratory organs: Secondary | ICD-10-CM

## 2023-09-21 DIAGNOSIS — Z87891 Personal history of nicotine dependence: Secondary | ICD-10-CM

## 2023-09-21 NOTE — Telephone Encounter (Signed)
Returned all to patient by phone to review results of LDCT.  Patient has reviewed results in mychart. Atherosclerosis and emphysema noted.  Patient is on statin medication.  Small pulmonary nodules lung RADS2 with no findings suspicious for lung cancer.  Cyst on right kidney with no additional imaging recommended. Plan for annual LDCT Oct 2025.  Patient acknowledged understanding and had no questions.

## 2023-09-28 ENCOUNTER — Ambulatory Visit (INDEPENDENT_AMBULATORY_CARE_PROVIDER_SITE_OTHER): Payer: Medicare Other

## 2023-09-28 DIAGNOSIS — R002 Palpitations: Secondary | ICD-10-CM

## 2023-09-28 DIAGNOSIS — I480 Paroxysmal atrial fibrillation: Secondary | ICD-10-CM

## 2023-09-29 LAB — CUP PACEART REMOTE DEVICE CHECK
Date Time Interrogation Session: 20241027231257
Implantable Pulse Generator Implant Date: 20230503

## 2023-09-30 NOTE — Progress Notes (Unsigned)
   Electrophysiology Office Note:   Date:  10/01/2023  ID:  Brandon [name], DOB May 28, 1951, MRN [id number]  Primary Cardiologist: None Electrophysiologist: Lanier Prude, MD      History of Present Illness:   Brandon [name] is a 72 y.o. male with h/o AF s/p ablation and palpitations seen today for routine electrophysiology followup.   Since last being seen in our clinic the patient reports doing very well. Has rare, singular palpitations some evenings consistent with PVCs.  he denies chest pain, palpitations, dyspnea, PND, orthopnea, nausea, vomiting, dizziness, syncope, edema, weight gain, or early satiety.   Review of systems complete and found to be negative unless listed in HPI.   Device History: Medtronic loop recorder implanted 03/2022 for Atrial fibrillation  Studies Reviewed:    EKG is not ordered today. Presenting on Loop recorder consistent with NSR. AF burden 0.0% for period starting 08/2022   Arrhythmia History  AF ablation 12/19/2021   Physical Exam:   VS:  BP 116/68   Pulse 65   Ht 6' (1.829 m)   Wt 196 lb 6.4 oz (89.1 kg)   SpO2 95%   BMI 26.64 kg/m    Wt Readings from Last 3 Encounters:  10/01/23 196 lb 6.4 oz (89.1 kg)  09/17/22 193 lb 3.2 oz (87.6 kg)  08/27/22 193 lb 3.2 oz (87.6 kg)     GEN: Well nourished, well developed in no acute distress NECK: No JVD; No carotid bruits CARDIAC: Regular rate and rhythm, no murmurs, rubs, gallops RESPIRATORY:  Clear to auscultation without rales, wheezing or rhonchi  ABDOMEN: Soft, non-tender, non-distended EXTREMITIES:  No edema; No deformity   ILR Interrogation- reviewed in detail today,  See PACEART report  ASSESSMENT AND PLAN:    Atrial fibrillation s/p Medtronic Loop recorder S/p AF ablation 12/2021 Normal device function See ongoing remote reports Burden 0.0% No changes today    Follow up with Dr. Lalla Brothers in 12 months  Signed, Graciella Freer, PA-C

## 2023-10-01 ENCOUNTER — Ambulatory Visit: Payer: Medicare Other | Attending: Student | Admitting: Student

## 2023-10-01 ENCOUNTER — Encounter: Payer: Self-pay | Admitting: Student

## 2023-10-01 VITALS — BP 116/68 | HR 65 | Ht 72.0 in | Wt 196.4 lb

## 2023-10-01 DIAGNOSIS — R002 Palpitations: Secondary | ICD-10-CM | POA: Insufficient documentation

## 2023-10-01 DIAGNOSIS — I480 Paroxysmal atrial fibrillation: Secondary | ICD-10-CM | POA: Diagnosis present

## 2023-10-01 NOTE — Patient Instructions (Signed)
Medication Instructions:  Your physician recommends that you continue on your current medications as directed. Please refer to the Current Medication list given to you today.  *If you need a refill on your cardiac medications before your next appointment, please call your pharmacy*   Lab Work: None If you have labs (blood work) drawn today and your tests are completely normal, you will receive your results only by: MyChart Message (if you have MyChart) OR A paper copy in the mail If you have any lab test that is abnormal or we need to change your treatment, we will call you to review the results.   Follow-Up: At [facility], you and your health needs are our priority.  As part of our continuing mission to provide you with exceptional heart care, we have created designated Provider Care Teams.  These Care Teams include your primary Cardiologist (physician) and Advanced Practice Providers (APPs -  Physician Assistants and Nurse Practitioners) who all work together to provide you with the care you need, when you need it.  Your next appointment:   1 year(s)  Provider:   Cameron Lambert, MD  

## 2023-10-05 ENCOUNTER — Ambulatory Visit: Payer: Medicare Other

## 2023-10-14 NOTE — Progress Notes (Signed)
Carelink Summary Report / Loop Recorder 

## 2023-11-01 LAB — CUP PACEART REMOTE DEVICE CHECK
Date Time Interrogation Session: 20241129230522
Implantable Pulse Generator Implant Date: 20230503

## 2023-11-02 ENCOUNTER — Ambulatory Visit: Payer: Medicare Other

## 2023-11-02 DIAGNOSIS — I480 Paroxysmal atrial fibrillation: Secondary | ICD-10-CM | POA: Diagnosis not present

## 2023-11-09 ENCOUNTER — Ambulatory Visit: Payer: Medicare Other

## 2023-12-07 ENCOUNTER — Ambulatory Visit (INDEPENDENT_AMBULATORY_CARE_PROVIDER_SITE_OTHER): Payer: Medicare Other

## 2023-12-07 DIAGNOSIS — I480 Paroxysmal atrial fibrillation: Secondary | ICD-10-CM | POA: Diagnosis not present

## 2023-12-07 LAB — CUP PACEART REMOTE DEVICE CHECK
Date Time Interrogation Session: 20250105231248
Implantable Pulse Generator Implant Date: 20230503

## 2023-12-14 ENCOUNTER — Ambulatory Visit: Payer: Medicare Other

## 2024-01-11 ENCOUNTER — Ambulatory Visit (INDEPENDENT_AMBULATORY_CARE_PROVIDER_SITE_OTHER): Payer: Medicare Other

## 2024-01-11 DIAGNOSIS — I480 Paroxysmal atrial fibrillation: Secondary | ICD-10-CM

## 2024-01-11 DIAGNOSIS — R002 Palpitations: Secondary | ICD-10-CM

## 2024-01-11 LAB — CUP PACEART REMOTE DEVICE CHECK
Date Time Interrogation Session: 20250209231417
Implantable Pulse Generator Implant Date: 20230503

## 2024-01-13 NOTE — Progress Notes (Signed)
[facility] Summary Report / Loop Recorder

## 2024-01-14 ENCOUNTER — Encounter: Payer: Self-pay | Admitting: Cardiology

## 2024-01-18 ENCOUNTER — Ambulatory Visit: Payer: Medicare Other

## 2024-02-06 ENCOUNTER — Other Ambulatory Visit: Payer: Self-pay | Admitting: Cardiology

## 2024-02-08 MED ORDER — METOPROLOL SUCCINATE ER 25 MG PO TB24: 25.0000 mg | ORAL_TABLET | Freq: Every day | ORAL | 2 refills | Status: AC

## 2024-02-15 ENCOUNTER — Ambulatory Visit: Payer: Medicare Other

## 2024-02-15 DIAGNOSIS — I480 Paroxysmal atrial fibrillation: Secondary | ICD-10-CM

## 2024-02-15 NOTE — Progress Notes (Signed)
 Carelink Summary Report / Loop Recorder

## 2024-02-17 LAB — CUP PACEART REMOTE DEVICE CHECK
Date Time Interrogation Session: 20250316231157
Implantable Pulse Generator Implant Date: 20230503

## 2024-02-19 ENCOUNTER — Ambulatory Visit
Admission: RE | Admit: 2024-02-19 | Discharge: 2024-02-19 | Disposition: A | Discharge status: Home / Self Care | Source: Ambulatory Visit | Attending: Family Medicine | Admitting: Family Medicine

## 2024-02-19 ENCOUNTER — Other Ambulatory Visit: Payer: Self-pay | Admitting: Family Medicine

## 2024-02-19 DIAGNOSIS — M25531 Pain in right wrist: Secondary | ICD-10-CM

## 2024-02-21 ENCOUNTER — Encounter: Payer: Self-pay | Admitting: Cardiology

## 2024-02-22 ENCOUNTER — Ambulatory Visit: Payer: Medicare Other

## 2024-03-21 ENCOUNTER — Ambulatory Visit: Payer: Medicare Other

## 2024-03-21 DIAGNOSIS — I480 Paroxysmal atrial fibrillation: Secondary | ICD-10-CM

## 2024-03-22 LAB — CUP PACEART REMOTE DEVICE CHECK
Date Time Interrogation Session: 20250420231211
Implantable Pulse Generator Implant Date: 20230503

## 2024-03-27 ENCOUNTER — Encounter: Payer: Self-pay | Admitting: Cardiology

## 2024-03-28 ENCOUNTER — Ambulatory Visit: Payer: Medicare Other

## 2024-03-29 NOTE — Progress Notes (Signed)
 Carelink Summary Report / Loop Recorder

## 2024-03-29 NOTE — Addendum Note (Signed)
 Addended by: Edra Govern D on: 03/29/2024 05:00 PM   Modules accepted: Orders

## 2024-04-21 ENCOUNTER — Ambulatory Visit: Payer: Self-pay | Admitting: Cardiology

## 2024-04-21 ENCOUNTER — Ambulatory Visit (INDEPENDENT_AMBULATORY_CARE_PROVIDER_SITE_OTHER)

## 2024-04-21 DIAGNOSIS — I480 Paroxysmal atrial fibrillation: Secondary | ICD-10-CM

## 2024-04-21 LAB — CUP PACEART REMOTE DEVICE CHECK
Date Time Interrogation Session: 20250521231439
Implantable Pulse Generator Implant Date: 20230503

## 2024-05-02 ENCOUNTER — Ambulatory Visit: Payer: Medicare Other

## 2024-05-06 NOTE — Addendum Note (Signed)
 Addended by: Edra Govern D on: 05/06/2024 09:47 AM   Modules accepted: Orders

## 2024-05-06 NOTE — Progress Notes (Signed)
 Carelink Summary Report / Loop Recorder

## 2024-05-23 ENCOUNTER — Ambulatory Visit: Payer: Self-pay | Admitting: Cardiology

## 2024-05-23 ENCOUNTER — Ambulatory Visit

## 2024-05-23 DIAGNOSIS — I480 Paroxysmal atrial fibrillation: Secondary | ICD-10-CM

## 2024-05-23 DIAGNOSIS — R002 Palpitations: Secondary | ICD-10-CM

## 2024-05-23 LAB — CUP PACEART REMOTE DEVICE CHECK
Date Time Interrogation Session: 20250622232455
Implantable Pulse Generator Implant Date: 20230503

## 2024-05-30 ENCOUNTER — Ambulatory Visit: Payer: Medicare Other

## 2024-05-31 NOTE — Progress Notes (Signed)
 Carelink Summary Report / Loop Recorder

## 2024-06-06 ENCOUNTER — Ambulatory Visit: Payer: Medicare Other

## 2024-06-17 ENCOUNTER — Encounter: Payer: Self-pay | Admitting: Advanced Practice Midwife

## 2024-06-23 ENCOUNTER — Ambulatory Visit: Payer: Self-pay | Admitting: Cardiology

## 2024-06-23 ENCOUNTER — Ambulatory Visit

## 2024-06-23 DIAGNOSIS — I480 Paroxysmal atrial fibrillation: Secondary | ICD-10-CM

## 2024-06-23 LAB — CUP PACEART REMOTE DEVICE CHECK
Date Time Interrogation Session: 20250723232419
Implantable Pulse Generator Implant Date: 20230503

## 2024-06-24 NOTE — Progress Notes (Signed)
 Carelink Summary Report / Loop Recorder

## 2024-07-04 ENCOUNTER — Ambulatory Visit: Payer: Medicare Other

## 2024-07-11 ENCOUNTER — Ambulatory Visit: Payer: Medicare Other

## 2024-07-25 ENCOUNTER — Ambulatory Visit

## 2024-07-25 DIAGNOSIS — I480 Paroxysmal atrial fibrillation: Secondary | ICD-10-CM

## 2024-07-26 LAB — CUP PACEART REMOTE DEVICE CHECK
Date Time Interrogation Session: 20250823231950
Implantable Pulse Generator Implant Date: 20230503

## 2024-07-27 ENCOUNTER — Ambulatory Visit: Payer: Self-pay | Admitting: Cardiology

## 2024-08-08 ENCOUNTER — Ambulatory Visit: Payer: Medicare Other

## 2024-08-15 ENCOUNTER — Ambulatory Visit: Payer: Medicare Other

## 2024-08-17 NOTE — Progress Notes (Signed)
 Remote Loop Recorder Transmission

## 2024-08-25 ENCOUNTER — Ambulatory Visit (INDEPENDENT_AMBULATORY_CARE_PROVIDER_SITE_OTHER)

## 2024-08-25 DIAGNOSIS — I480 Paroxysmal atrial fibrillation: Secondary | ICD-10-CM | POA: Diagnosis not present

## 2024-08-25 LAB — CUP PACEART REMOTE DEVICE CHECK
Date Time Interrogation Session: 20250924231956
Implantable Pulse Generator Implant Date: 20230503

## 2024-08-26 ENCOUNTER — Ambulatory Visit: Payer: Self-pay | Admitting: Cardiology

## 2024-08-29 NOTE — Progress Notes (Signed)
 Remote Loop Recorder Transmission

## 2024-09-01 NOTE — Progress Notes (Signed)
 Remote Loop Recorder Transmission

## 2024-09-05 ENCOUNTER — Ambulatory Visit
Admission: RE | Admit: 2024-09-05 | Discharge: 2024-09-05 | Disposition: A | Discharge status: Home / Self Care | Source: Ambulatory Visit | Attending: Acute Care | Admitting: Acute Care

## 2024-09-05 DIAGNOSIS — Z87891 Personal history of nicotine dependence: Secondary | ICD-10-CM

## 2024-09-05 DIAGNOSIS — Z122 Encounter for screening for malignant neoplasm of respiratory organs: Secondary | ICD-10-CM

## 2024-09-08 ENCOUNTER — Ambulatory Visit

## 2024-09-08 ENCOUNTER — Other Ambulatory Visit: Payer: Self-pay

## 2024-09-08 DIAGNOSIS — Z122 Encounter for screening for malignant neoplasm of respiratory organs: Secondary | ICD-10-CM

## 2024-09-08 DIAGNOSIS — Z87891 Personal history of nicotine dependence: Secondary | ICD-10-CM

## 2024-09-26 ENCOUNTER — Ambulatory Visit (INDEPENDENT_AMBULATORY_CARE_PROVIDER_SITE_OTHER)

## 2024-09-26 DIAGNOSIS — I480 Paroxysmal atrial fibrillation: Secondary | ICD-10-CM | POA: Diagnosis not present

## 2024-09-26 LAB — CUP PACEART REMOTE DEVICE CHECK
Date Time Interrogation Session: 20251026232208
Implantable Pulse Generator Implant Date: 20230503

## 2024-09-28 ENCOUNTER — Ambulatory Visit: Payer: Self-pay | Admitting: Cardiology

## 2024-09-28 NOTE — Progress Notes (Signed)
 Remote Loop Recorder Transmission

## 2024-10-10 ENCOUNTER — Ambulatory Visit

## 2024-10-27 ENCOUNTER — Ambulatory Visit

## 2024-10-27 DIAGNOSIS — I480 Paroxysmal atrial fibrillation: Secondary | ICD-10-CM

## 2024-10-28 LAB — CUP PACEART REMOTE DEVICE CHECK
Date Time Interrogation Session: 20251126232758
Implantable Pulse Generator Implant Date: 20230503

## 2024-10-31 ENCOUNTER — Ambulatory Visit: Payer: Self-pay | Admitting: Cardiology

## 2024-11-02 NOTE — Progress Notes (Signed)
 Remote Loop Recorder Transmission

## 2024-11-10 ENCOUNTER — Ambulatory Visit

## 2024-11-14 NOTE — Progress Notes (Unsigned)
°  Electrophysiology Office Follow up Visit Note:    Date:  11/17/2024   ID:  Brandon [name], DOB 04-Jul-1951, MRN [id number]  PCP:  Leonel Cole, MD  [facility] Cardiologist:  None  [facility] Electrophysiologist:  OLE ONEIDA HOLTS, MD    Interval History:     Brandon [name] is a 73 y.o. male who presents for a follow up visit.   The patient last saw [facility] October 01, 2023.  He has a history of atrial fibrillation with prior catheter ablation.  0% burden of A-fib on loop recorder.  He is doing well today.  No recurrence of atrial fibrillation.      Past medical, surgical, social and family history were reviewed.  ROS:   Please see the history of present illness.    All other systems reviewed and are negative.  EKGs/Labs/Other Studies Reviewed:    The following studies were reviewed today:          Physical Exam:    VS:  There were no vitals taken for this visit.    Wt Readings from Last 3 Encounters:  10/01/23 196 lb 6.4 oz (89.1 kg)  09/17/22 193 lb 3.2 oz (87.6 kg)  08/27/22 193 lb 3.2 oz (87.6 kg)     GEN: no distress CARD: RRR, No MRG RESP: No IWOB. CTAB.      ASSESSMENT:    1. Paroxysmal atrial fibrillation (HCC)    PLAN:    In order of problems listed above:  #Atrial fibrillation No episodes of atrial fibrillation on loop recorder monitoring. On aspirin  for stroke prophylaxis Continue metoprolol   I discussed my upcoming departure from [facility] during today's clinic appointment.  The patient will continue to follow-up with one of my EP partners moving forward.  Follow-up 1 year with EP APP  Signed, Ole Holts, MD, [facility], [facility] 11/17/2024 10:58 AM    Electrophysiology [facility]

## 2024-11-17 ENCOUNTER — Ambulatory Visit: Attending: Cardiology | Admitting: Cardiology

## 2024-11-17 ENCOUNTER — Encounter: Payer: Self-pay | Admitting: Cardiology

## 2024-11-17 VITALS — BP 130/72 | HR 58 | Ht 72.0 in | Wt 195.0 lb

## 2024-11-17 DIAGNOSIS — I480 Paroxysmal atrial fibrillation: Secondary | ICD-10-CM | POA: Insufficient documentation

## 2024-11-17 NOTE — Patient Instructions (Signed)
 Medication Instructions:  Your physician recommends that you continue on your current medications as directed. Please refer to the Current Medication list given to you today.  *If you need a refill on your cardiac medications before your next appointment, please call your pharmacy*   Follow-Up: At [facility], you and your health needs are our priority.  As part of our continuing mission to provide you with exceptional heart care, our providers are all part of one team.  This team includes your primary Cardiologist (physician) and Advanced Practice Providers or APPs (Physician Assistants and Nurse Practitioners) who all work together to provide you with the care you need, when you need it.  Your next appointment:   1 year(s)  Provider:   You will see one of the following Advanced Practice Providers on your designated Care Team:   Charlies Arthur, [place] Ozell Jodie Passey, PA-C Suzann Riddle, NP Daphne Barrack, NP Artist Pouch, PA-C

## 2024-11-27 ENCOUNTER — Ambulatory Visit

## 2024-11-27 DIAGNOSIS — I480 Paroxysmal atrial fibrillation: Secondary | ICD-10-CM | POA: Diagnosis not present

## 2024-11-28 ENCOUNTER — Ambulatory Visit

## 2024-11-28 LAB — CUP PACEART REMOTE DEVICE CHECK
Date Time Interrogation Session: 20251227232215
Implantable Pulse Generator Implant Date: 20230503

## 2024-11-30 ENCOUNTER — Ambulatory Visit: Payer: Self-pay | Admitting: Cardiovascular Disease

## 2024-12-05 NOTE — Progress Notes (Signed)
 Remote Loop Recorder Transmission

## 2024-12-12 ENCOUNTER — Ambulatory Visit

## 2024-12-28 ENCOUNTER — Ambulatory Visit

## 2024-12-28 DIAGNOSIS — I480 Paroxysmal atrial fibrillation: Secondary | ICD-10-CM

## 2024-12-28 LAB — CUP PACEART REMOTE DEVICE CHECK
Date Time Interrogation Session: 20260127231218
Implantable Pulse Generator Implant Date: 20230503

## 2024-12-29 ENCOUNTER — Ambulatory Visit: Payer: Self-pay | Admitting: Cardiovascular Disease

## 2025-01-05 NOTE — Progress Notes (Signed)
 Remote Loop Recorder Transmission

## 2025-01-12 ENCOUNTER — Ambulatory Visit

## 2025-01-28 ENCOUNTER — Ambulatory Visit
